# Patient Record
Sex: Male | Born: 1950 | Race: White | Hispanic: No | Smoking: Former smoker
Health system: Southern US, Community
[De-identification: ages and names within clinical notes are randomized; demographics above are authoritative.]

## PROBLEM LIST (undated history)

## (undated) DIAGNOSIS — C4490 Unspecified malignant neoplasm of skin, unspecified: Secondary | ICD-10-CM

## (undated) DIAGNOSIS — E079 Disorder of thyroid, unspecified: Secondary | ICD-10-CM

## (undated) DIAGNOSIS — B1920 Unspecified viral hepatitis C without hepatic coma: Secondary | ICD-10-CM

## (undated) HISTORY — DX: Unspecified viral hepatitis C without hepatic coma: B19.20

## (undated) HISTORY — DX: Disorder of thyroid, unspecified: E07.9

## (undated) HISTORY — PX: TONSILLECTOMY AND ADENOIDECTOMY: SHX28

## (undated) HISTORY — DX: Unspecified malignant neoplasm of skin, unspecified: C44.90

## (undated) HISTORY — PX: BACK SURGERY: SHX140

---

## 2007-12-08 ENCOUNTER — Ambulatory Visit (HOSPITAL_COMMUNITY): Admission: RE | Admit: 2007-12-08 | Discharge: 2007-12-08 | Payer: Self-pay | Admitting: Internal Medicine

## 2007-12-19 ENCOUNTER — Encounter (HOSPITAL_COMMUNITY): Admission: RE | Admit: 2007-12-19 | Discharge: 2008-01-18 | Payer: Self-pay | Admitting: Internal Medicine

## 2008-07-31 ENCOUNTER — Ambulatory Visit (HOSPITAL_COMMUNITY): Admission: RE | Admit: 2008-07-31 | Discharge: 2008-07-31 | Payer: Self-pay | Admitting: Endocrinology

## 2008-12-05 ENCOUNTER — Ambulatory Visit (HOSPITAL_COMMUNITY): Admission: RE | Admit: 2008-12-05 | Discharge: 2008-12-05 | Payer: Self-pay | Admitting: Endocrinology

## 2009-12-11 ENCOUNTER — Ambulatory Visit (HOSPITAL_COMMUNITY): Admission: RE | Admit: 2009-12-11 | Discharge: 2009-12-11 | Payer: Self-pay | Admitting: Endocrinology

## 2010-08-24 ENCOUNTER — Encounter: Payer: Self-pay | Admitting: Endocrinology

## 2012-04-20 ENCOUNTER — Other Ambulatory Visit (HOSPITAL_COMMUNITY): Payer: Self-pay | Admitting: Endocrinology

## 2012-04-20 DIAGNOSIS — E049 Nontoxic goiter, unspecified: Secondary | ICD-10-CM

## 2012-04-25 ENCOUNTER — Ambulatory Visit (HOSPITAL_COMMUNITY)
Admission: RE | Admit: 2012-04-25 | Discharge: 2012-04-25 | Disposition: A | Discharge status: Home / Self Care | Payer: No Typology Code available for payment source | Source: Ambulatory Visit | Attending: Endocrinology | Admitting: Endocrinology

## 2012-04-25 DIAGNOSIS — E049 Nontoxic goiter, unspecified: Secondary | ICD-10-CM

## 2012-08-28 ENCOUNTER — Other Ambulatory Visit (HOSPITAL_COMMUNITY): Payer: Self-pay | Admitting: Endocrinology

## 2012-08-28 DIAGNOSIS — E049 Nontoxic goiter, unspecified: Secondary | ICD-10-CM

## 2012-08-29 ENCOUNTER — Ambulatory Visit (HOSPITAL_COMMUNITY)
Admission: RE | Admit: 2012-08-29 | Discharge: 2012-08-29 | Disposition: A | Discharge status: Home / Self Care | Payer: PRIVATE HEALTH INSURANCE | Source: Ambulatory Visit | Attending: Endocrinology | Admitting: Endocrinology

## 2012-08-29 DIAGNOSIS — E049 Nontoxic goiter, unspecified: Secondary | ICD-10-CM

## 2012-08-29 DIAGNOSIS — E041 Nontoxic single thyroid nodule: Secondary | ICD-10-CM | POA: Insufficient documentation

## 2013-04-05 ENCOUNTER — Other Ambulatory Visit (HOSPITAL_COMMUNITY): Payer: Self-pay | Admitting: Family Medicine

## 2013-04-05 ENCOUNTER — Ambulatory Visit (HOSPITAL_COMMUNITY)
Admission: RE | Admit: 2013-04-05 | Discharge: 2013-04-05 | Disposition: A | Discharge status: Home / Self Care | Payer: Disability Insurance | Source: Ambulatory Visit | Attending: Family Medicine | Admitting: Family Medicine

## 2013-04-05 DIAGNOSIS — M542 Cervicalgia: Secondary | ICD-10-CM | POA: Insufficient documentation

## 2013-04-05 DIAGNOSIS — M5380 Other specified dorsopathies, site unspecified: Secondary | ICD-10-CM | POA: Insufficient documentation

## 2013-04-17 ENCOUNTER — Other Ambulatory Visit (HOSPITAL_COMMUNITY): Payer: Self-pay | Admitting: Endocrinology

## 2013-04-17 DIAGNOSIS — E049 Nontoxic goiter, unspecified: Secondary | ICD-10-CM

## 2013-06-19 ENCOUNTER — Other Ambulatory Visit (HOSPITAL_COMMUNITY): Payer: Self-pay | Admitting: Internal Medicine

## 2013-06-19 DIAGNOSIS — M542 Cervicalgia: Secondary | ICD-10-CM

## 2013-06-21 ENCOUNTER — Other Ambulatory Visit (HOSPITAL_COMMUNITY): Payer: BC Managed Care – PPO

## 2013-07-16 ENCOUNTER — Other Ambulatory Visit (HOSPITAL_COMMUNITY): Payer: BC Managed Care – PPO

## 2013-07-30 ENCOUNTER — Other Ambulatory Visit (HOSPITAL_COMMUNITY): Payer: Self-pay | Admitting: Family Medicine

## 2013-07-30 DIAGNOSIS — G8929 Other chronic pain: Secondary | ICD-10-CM

## 2013-07-30 DIAGNOSIS — M542 Cervicalgia: Secondary | ICD-10-CM

## 2013-08-03 ENCOUNTER — Other Ambulatory Visit (HOSPITAL_COMMUNITY): Payer: BC Managed Care – PPO

## 2013-08-03 ENCOUNTER — Ambulatory Visit (HOSPITAL_COMMUNITY)
Admission: RE | Admit: 2013-08-03 | Discharge: 2013-08-03 | Disposition: A | Discharge status: Home / Self Care | Payer: BC Managed Care – PPO | Source: Ambulatory Visit | Attending: Family Medicine | Admitting: Family Medicine

## 2013-08-03 DIAGNOSIS — M539 Dorsopathy, unspecified: Secondary | ICD-10-CM | POA: Insufficient documentation

## 2013-08-03 DIAGNOSIS — M4802 Spinal stenosis, cervical region: Secondary | ICD-10-CM | POA: Insufficient documentation

## 2013-08-03 DIAGNOSIS — G8929 Other chronic pain: Secondary | ICD-10-CM

## 2013-08-03 DIAGNOSIS — M542 Cervicalgia: Secondary | ICD-10-CM | POA: Insufficient documentation

## 2013-08-07 ENCOUNTER — Ambulatory Visit (HOSPITAL_COMMUNITY)
Admission: RE | Admit: 2013-08-07 | Discharge: 2013-08-07 | Disposition: A | Discharge status: Home / Self Care | Payer: BC Managed Care – PPO | Source: Ambulatory Visit | Attending: Endocrinology | Admitting: Endocrinology

## 2013-08-07 DIAGNOSIS — E041 Nontoxic single thyroid nodule: Secondary | ICD-10-CM | POA: Insufficient documentation

## 2013-08-07 DIAGNOSIS — E049 Nontoxic goiter, unspecified: Secondary | ICD-10-CM

## 2014-07-02 ENCOUNTER — Other Ambulatory Visit (HOSPITAL_COMMUNITY): Payer: Self-pay | Admitting: Endocrinology

## 2014-07-02 DIAGNOSIS — E049 Nontoxic goiter, unspecified: Secondary | ICD-10-CM

## 2014-07-08 ENCOUNTER — Ambulatory Visit (HOSPITAL_COMMUNITY)
Admission: RE | Admit: 2014-07-08 | Discharge: 2014-07-08 | Disposition: A | Discharge status: Home / Self Care | Payer: BC Managed Care – PPO | Source: Ambulatory Visit | Attending: Endocrinology | Admitting: Endocrinology

## 2014-07-08 DIAGNOSIS — E041 Nontoxic single thyroid nodule: Secondary | ICD-10-CM | POA: Insufficient documentation

## 2014-07-08 DIAGNOSIS — E049 Nontoxic goiter, unspecified: Secondary | ICD-10-CM

## 2015-07-22 ENCOUNTER — Other Ambulatory Visit (HOSPITAL_COMMUNITY): Payer: Self-pay | Admitting: Endocrinology

## 2015-07-22 DIAGNOSIS — E049 Nontoxic goiter, unspecified: Secondary | ICD-10-CM

## 2015-08-07 ENCOUNTER — Ambulatory Visit (HOSPITAL_COMMUNITY)
Admission: RE | Admit: 2015-08-07 | Discharge: 2015-08-07 | Disposition: A | Discharge status: Home / Self Care | Payer: BLUE CROSS/BLUE SHIELD | Source: Ambulatory Visit | Attending: Endocrinology | Admitting: Endocrinology

## 2015-08-07 DIAGNOSIS — E041 Nontoxic single thyroid nodule: Secondary | ICD-10-CM | POA: Diagnosis present

## 2015-08-07 DIAGNOSIS — E049 Nontoxic goiter, unspecified: Secondary | ICD-10-CM

## 2016-03-15 DIAGNOSIS — M47812 Spondylosis without myelopathy or radiculopathy, cervical region: Secondary | ICD-10-CM | POA: Diagnosis not present

## 2016-03-15 DIAGNOSIS — Z6827 Body mass index (BMI) 27.0-27.9, adult: Secondary | ICD-10-CM | POA: Diagnosis not present

## 2016-03-15 DIAGNOSIS — G894 Chronic pain syndrome: Secondary | ICD-10-CM | POA: Diagnosis not present

## 2016-04-28 DIAGNOSIS — Z23 Encounter for immunization: Secondary | ICD-10-CM | POA: Diagnosis not present

## 2016-05-27 DIAGNOSIS — Z0000 Encounter for general adult medical examination without abnormal findings: Secondary | ICD-10-CM | POA: Diagnosis not present

## 2016-05-27 DIAGNOSIS — L739 Follicular disorder, unspecified: Secondary | ICD-10-CM | POA: Diagnosis not present

## 2016-05-27 DIAGNOSIS — M47812 Spondylosis without myelopathy or radiculopathy, cervical region: Secondary | ICD-10-CM | POA: Diagnosis not present

## 2016-05-27 DIAGNOSIS — Z6827 Body mass index (BMI) 27.0-27.9, adult: Secondary | ICD-10-CM | POA: Diagnosis not present

## 2016-05-27 DIAGNOSIS — G894 Chronic pain syndrome: Secondary | ICD-10-CM | POA: Diagnosis not present

## 2016-08-30 DIAGNOSIS — D2272 Melanocytic nevi of left lower limb, including hip: Secondary | ICD-10-CM | POA: Diagnosis not present

## 2016-08-30 DIAGNOSIS — D2261 Melanocytic nevi of right upper limb, including shoulder: Secondary | ICD-10-CM | POA: Diagnosis not present

## 2016-08-30 DIAGNOSIS — D225 Melanocytic nevi of trunk: Secondary | ICD-10-CM | POA: Diagnosis not present

## 2016-08-30 DIAGNOSIS — L821 Other seborrheic keratosis: Secondary | ICD-10-CM | POA: Diagnosis not present

## 2016-08-30 DIAGNOSIS — L820 Inflamed seborrheic keratosis: Secondary | ICD-10-CM | POA: Diagnosis not present

## 2016-08-30 DIAGNOSIS — L570 Actinic keratosis: Secondary | ICD-10-CM | POA: Diagnosis not present

## 2016-08-31 DIAGNOSIS — E663 Overweight: Secondary | ICD-10-CM | POA: Diagnosis not present

## 2016-08-31 DIAGNOSIS — Z6828 Body mass index (BMI) 28.0-28.9, adult: Secondary | ICD-10-CM | POA: Diagnosis not present

## 2016-08-31 DIAGNOSIS — G894 Chronic pain syndrome: Secondary | ICD-10-CM | POA: Diagnosis not present

## 2016-08-31 DIAGNOSIS — M47812 Spondylosis without myelopathy or radiculopathy, cervical region: Secondary | ICD-10-CM | POA: Diagnosis not present

## 2016-08-31 DIAGNOSIS — Z1389 Encounter for screening for other disorder: Secondary | ICD-10-CM | POA: Diagnosis not present

## 2016-09-03 DIAGNOSIS — G894 Chronic pain syndrome: Secondary | ICD-10-CM | POA: Diagnosis not present

## 2016-09-03 DIAGNOSIS — Z6828 Body mass index (BMI) 28.0-28.9, adult: Secondary | ICD-10-CM | POA: Diagnosis not present

## 2016-11-11 DIAGNOSIS — R69 Illness, unspecified: Secondary | ICD-10-CM | POA: Diagnosis not present

## 2016-11-24 DIAGNOSIS — G894 Chronic pain syndrome: Secondary | ICD-10-CM | POA: Diagnosis not present

## 2016-11-24 DIAGNOSIS — E663 Overweight: Secondary | ICD-10-CM | POA: Diagnosis not present

## 2016-11-24 DIAGNOSIS — Z6827 Body mass index (BMI) 27.0-27.9, adult: Secondary | ICD-10-CM | POA: Diagnosis not present

## 2016-11-30 DIAGNOSIS — R69 Illness, unspecified: Secondary | ICD-10-CM | POA: Diagnosis not present

## 2016-12-01 DIAGNOSIS — E119 Type 2 diabetes mellitus without complications: Secondary | ICD-10-CM | POA: Diagnosis not present

## 2016-12-01 DIAGNOSIS — H524 Presbyopia: Secondary | ICD-10-CM | POA: Diagnosis not present

## 2017-01-17 DIAGNOSIS — R69 Illness, unspecified: Secondary | ICD-10-CM | POA: Diagnosis not present

## 2017-01-17 DIAGNOSIS — Z6826 Body mass index (BMI) 26.0-26.9, adult: Secondary | ICD-10-CM | POA: Diagnosis not present

## 2017-01-17 DIAGNOSIS — G894 Chronic pain syndrome: Secondary | ICD-10-CM | POA: Diagnosis not present

## 2017-03-10 DIAGNOSIS — T07XXXA Unspecified multiple injuries, initial encounter: Secondary | ICD-10-CM | POA: Diagnosis not present

## 2017-03-10 DIAGNOSIS — Z6827 Body mass index (BMI) 27.0-27.9, adult: Secondary | ICD-10-CM | POA: Diagnosis not present

## 2017-03-10 DIAGNOSIS — Z1389 Encounter for screening for other disorder: Secondary | ICD-10-CM | POA: Diagnosis not present

## 2017-03-10 DIAGNOSIS — E663 Overweight: Secondary | ICD-10-CM | POA: Diagnosis not present

## 2017-04-11 DIAGNOSIS — Z6827 Body mass index (BMI) 27.0-27.9, adult: Secondary | ICD-10-CM | POA: Diagnosis not present

## 2017-04-11 DIAGNOSIS — H61892 Other specified disorders of left external ear: Secondary | ICD-10-CM | POA: Diagnosis not present

## 2017-04-11 DIAGNOSIS — E663 Overweight: Secondary | ICD-10-CM | POA: Diagnosis not present

## 2017-04-11 DIAGNOSIS — G894 Chronic pain syndrome: Secondary | ICD-10-CM | POA: Diagnosis not present

## 2017-04-26 DIAGNOSIS — Z23 Encounter for immunization: Secondary | ICD-10-CM | POA: Diagnosis not present

## 2017-05-16 ENCOUNTER — Ambulatory Visit (INDEPENDENT_AMBULATORY_CARE_PROVIDER_SITE_OTHER): Payer: Medicare HMO | Admitting: Otolaryngology

## 2017-05-16 DIAGNOSIS — H903 Sensorineural hearing loss, bilateral: Secondary | ICD-10-CM

## 2017-06-01 DIAGNOSIS — R69 Illness, unspecified: Secondary | ICD-10-CM | POA: Diagnosis not present

## 2017-06-17 DIAGNOSIS — Z1389 Encounter for screening for other disorder: Secondary | ICD-10-CM | POA: Diagnosis not present

## 2017-06-17 DIAGNOSIS — Z6827 Body mass index (BMI) 27.0-27.9, adult: Secondary | ICD-10-CM | POA: Diagnosis not present

## 2017-06-17 DIAGNOSIS — R69 Illness, unspecified: Secondary | ICD-10-CM | POA: Diagnosis not present

## 2017-06-17 DIAGNOSIS — M5412 Radiculopathy, cervical region: Secondary | ICD-10-CM | POA: Diagnosis not present

## 2017-06-17 DIAGNOSIS — Z0001 Encounter for general adult medical examination with abnormal findings: Secondary | ICD-10-CM | POA: Diagnosis not present

## 2017-06-17 DIAGNOSIS — R946 Abnormal results of thyroid function studies: Secondary | ICD-10-CM | POA: Diagnosis not present

## 2017-06-17 DIAGNOSIS — Z125 Encounter for screening for malignant neoplasm of prostate: Secondary | ICD-10-CM | POA: Diagnosis not present

## 2017-06-17 DIAGNOSIS — E663 Overweight: Secondary | ICD-10-CM | POA: Diagnosis not present

## 2017-06-20 DIAGNOSIS — E748 Other specified disorders of carbohydrate metabolism: Secondary | ICD-10-CM | POA: Diagnosis not present

## 2017-06-27 DIAGNOSIS — E663 Overweight: Secondary | ICD-10-CM | POA: Diagnosis not present

## 2017-06-27 DIAGNOSIS — Z6828 Body mass index (BMI) 28.0-28.9, adult: Secondary | ICD-10-CM | POA: Diagnosis not present

## 2017-06-27 DIAGNOSIS — R69 Illness, unspecified: Secondary | ICD-10-CM | POA: Diagnosis not present

## 2017-06-27 DIAGNOSIS — E063 Autoimmune thyroiditis: Secondary | ICD-10-CM | POA: Diagnosis not present

## 2017-06-27 DIAGNOSIS — G894 Chronic pain syndrome: Secondary | ICD-10-CM | POA: Diagnosis not present

## 2017-08-17 ENCOUNTER — Other Ambulatory Visit (HOSPITAL_COMMUNITY): Payer: Self-pay | Admitting: Endocrinology

## 2017-08-17 DIAGNOSIS — E039 Hypothyroidism, unspecified: Secondary | ICD-10-CM | POA: Diagnosis not present

## 2017-08-17 DIAGNOSIS — E049 Nontoxic goiter, unspecified: Secondary | ICD-10-CM

## 2017-08-24 ENCOUNTER — Ambulatory Visit (HOSPITAL_COMMUNITY)
Admission: RE | Admit: 2017-08-24 | Discharge: 2017-08-24 | Disposition: A | Discharge status: Home / Self Care | Payer: Medicare HMO | Source: Ambulatory Visit | Attending: Endocrinology | Admitting: Endocrinology

## 2017-08-24 DIAGNOSIS — E041 Nontoxic single thyroid nodule: Secondary | ICD-10-CM | POA: Insufficient documentation

## 2017-08-24 DIAGNOSIS — E049 Nontoxic goiter, unspecified: Secondary | ICD-10-CM

## 2017-09-09 DIAGNOSIS — R69 Illness, unspecified: Secondary | ICD-10-CM | POA: Diagnosis not present

## 2017-09-09 DIAGNOSIS — Z6827 Body mass index (BMI) 27.0-27.9, adult: Secondary | ICD-10-CM | POA: Diagnosis not present

## 2017-09-09 DIAGNOSIS — E663 Overweight: Secondary | ICD-10-CM | POA: Diagnosis not present

## 2017-09-09 DIAGNOSIS — G894 Chronic pain syndrome: Secondary | ICD-10-CM | POA: Diagnosis not present

## 2017-09-09 DIAGNOSIS — E063 Autoimmune thyroiditis: Secondary | ICD-10-CM | POA: Diagnosis not present

## 2017-10-24 DIAGNOSIS — D225 Melanocytic nevi of trunk: Secondary | ICD-10-CM | POA: Diagnosis not present

## 2017-10-24 DIAGNOSIS — D045 Carcinoma in situ of skin of trunk: Secondary | ICD-10-CM | POA: Diagnosis not present

## 2017-10-24 DIAGNOSIS — D2272 Melanocytic nevi of left lower limb, including hip: Secondary | ICD-10-CM | POA: Diagnosis not present

## 2017-10-24 DIAGNOSIS — L720 Epidermal cyst: Secondary | ICD-10-CM | POA: Diagnosis not present

## 2017-10-24 DIAGNOSIS — L821 Other seborrheic keratosis: Secondary | ICD-10-CM | POA: Diagnosis not present

## 2017-11-07 DIAGNOSIS — L821 Other seborrheic keratosis: Secondary | ICD-10-CM | POA: Diagnosis not present

## 2017-11-07 DIAGNOSIS — D045 Carcinoma in situ of skin of trunk: Secondary | ICD-10-CM | POA: Diagnosis not present

## 2017-11-18 DIAGNOSIS — Z1389 Encounter for screening for other disorder: Secondary | ICD-10-CM | POA: Diagnosis not present

## 2017-11-18 DIAGNOSIS — Z6827 Body mass index (BMI) 27.0-27.9, adult: Secondary | ICD-10-CM | POA: Diagnosis not present

## 2017-11-18 DIAGNOSIS — G894 Chronic pain syndrome: Secondary | ICD-10-CM | POA: Diagnosis not present

## 2017-11-18 DIAGNOSIS — E663 Overweight: Secondary | ICD-10-CM | POA: Diagnosis not present

## 2017-12-08 DIAGNOSIS — Z6827 Body mass index (BMI) 27.0-27.9, adult: Secondary | ICD-10-CM | POA: Diagnosis not present

## 2017-12-08 DIAGNOSIS — G894 Chronic pain syndrome: Secondary | ICD-10-CM | POA: Diagnosis not present

## 2017-12-08 DIAGNOSIS — E663 Overweight: Secondary | ICD-10-CM | POA: Diagnosis not present

## 2018-01-03 DIAGNOSIS — R69 Illness, unspecified: Secondary | ICD-10-CM | POA: Diagnosis not present

## 2018-01-17 DIAGNOSIS — R69 Illness, unspecified: Secondary | ICD-10-CM | POA: Diagnosis not present

## 2018-02-07 DIAGNOSIS — G894 Chronic pain syndrome: Secondary | ICD-10-CM | POA: Diagnosis not present

## 2018-02-07 DIAGNOSIS — Z6827 Body mass index (BMI) 27.0-27.9, adult: Secondary | ICD-10-CM | POA: Diagnosis not present

## 2018-02-07 DIAGNOSIS — R69 Illness, unspecified: Secondary | ICD-10-CM | POA: Diagnosis not present

## 2018-02-07 DIAGNOSIS — E063 Autoimmune thyroiditis: Secondary | ICD-10-CM | POA: Diagnosis not present

## 2018-04-22 DIAGNOSIS — R69 Illness, unspecified: Secondary | ICD-10-CM | POA: Diagnosis not present

## 2018-04-25 DIAGNOSIS — E663 Overweight: Secondary | ICD-10-CM | POA: Diagnosis not present

## 2018-04-25 DIAGNOSIS — Z1389 Encounter for screening for other disorder: Secondary | ICD-10-CM | POA: Diagnosis not present

## 2018-04-25 DIAGNOSIS — E063 Autoimmune thyroiditis: Secondary | ICD-10-CM | POA: Diagnosis not present

## 2018-04-25 DIAGNOSIS — G894 Chronic pain syndrome: Secondary | ICD-10-CM | POA: Diagnosis not present

## 2018-04-25 DIAGNOSIS — R69 Illness, unspecified: Secondary | ICD-10-CM | POA: Diagnosis not present

## 2018-04-25 DIAGNOSIS — Z6827 Body mass index (BMI) 27.0-27.9, adult: Secondary | ICD-10-CM | POA: Diagnosis not present

## 2018-05-24 DIAGNOSIS — Z6827 Body mass index (BMI) 27.0-27.9, adult: Secondary | ICD-10-CM | POA: Diagnosis not present

## 2018-05-24 DIAGNOSIS — G894 Chronic pain syndrome: Secondary | ICD-10-CM | POA: Diagnosis not present

## 2018-05-24 DIAGNOSIS — E063 Autoimmune thyroiditis: Secondary | ICD-10-CM | POA: Diagnosis not present

## 2018-05-24 DIAGNOSIS — N400 Enlarged prostate without lower urinary tract symptoms: Secondary | ICD-10-CM | POA: Diagnosis not present

## 2018-05-24 DIAGNOSIS — Z125 Encounter for screening for malignant neoplasm of prostate: Secondary | ICD-10-CM | POA: Diagnosis not present

## 2018-06-07 DIAGNOSIS — Z6827 Body mass index (BMI) 27.0-27.9, adult: Secondary | ICD-10-CM | POA: Diagnosis not present

## 2018-06-07 DIAGNOSIS — E663 Overweight: Secondary | ICD-10-CM | POA: Diagnosis not present

## 2018-06-07 DIAGNOSIS — R3129 Other microscopic hematuria: Secondary | ICD-10-CM | POA: Diagnosis not present

## 2018-06-08 ENCOUNTER — Other Ambulatory Visit (HOSPITAL_COMMUNITY): Payer: Self-pay | Admitting: Internal Medicine

## 2018-06-08 DIAGNOSIS — R319 Hematuria, unspecified: Secondary | ICD-10-CM

## 2018-06-30 ENCOUNTER — Ambulatory Visit (HOSPITAL_COMMUNITY): Payer: Medicare HMO

## 2018-07-07 DIAGNOSIS — R69 Illness, unspecified: Secondary | ICD-10-CM | POA: Diagnosis not present

## 2018-07-07 DIAGNOSIS — E063 Autoimmune thyroiditis: Secondary | ICD-10-CM | POA: Diagnosis not present

## 2018-07-07 DIAGNOSIS — G894 Chronic pain syndrome: Secondary | ICD-10-CM | POA: Diagnosis not present

## 2018-07-07 DIAGNOSIS — Z6827 Body mass index (BMI) 27.0-27.9, adult: Secondary | ICD-10-CM | POA: Diagnosis not present

## 2018-07-07 DIAGNOSIS — Z0001 Encounter for general adult medical examination with abnormal findings: Secondary | ICD-10-CM | POA: Diagnosis not present

## 2018-07-07 DIAGNOSIS — Z1389 Encounter for screening for other disorder: Secondary | ICD-10-CM | POA: Diagnosis not present

## 2018-07-07 DIAGNOSIS — E663 Overweight: Secondary | ICD-10-CM | POA: Diagnosis not present

## 2018-07-07 DIAGNOSIS — M5030 Other cervical disc degeneration, unspecified cervical region: Secondary | ICD-10-CM | POA: Diagnosis not present

## 2018-07-12 ENCOUNTER — Other Ambulatory Visit (HOSPITAL_COMMUNITY): Payer: Self-pay | Admitting: Internal Medicine

## 2018-07-12 ENCOUNTER — Ambulatory Visit (HOSPITAL_COMMUNITY)
Admission: RE | Admit: 2018-07-12 | Discharge: 2018-07-12 | Disposition: A | Discharge status: Home / Self Care | Payer: Medicare HMO | Source: Ambulatory Visit | Attending: Internal Medicine | Admitting: Internal Medicine

## 2018-07-12 DIAGNOSIS — N132 Hydronephrosis with renal and ureteral calculous obstruction: Secondary | ICD-10-CM | POA: Diagnosis not present

## 2018-07-12 DIAGNOSIS — R319 Hematuria, unspecified: Secondary | ICD-10-CM | POA: Diagnosis not present

## 2018-07-12 LAB — POCT I-STAT CREATININE: CREATININE: 0.7 mg/dL (ref 0.61–1.24)

## 2018-07-12 MED ORDER — IOPAMIDOL (ISOVUE-300) INJECTION 61%
150.0000 mL | Freq: Once | INTRAVENOUS | Status: AC | PRN
  Administered 2018-07-12: 125 mL via INTRAVENOUS

## 2018-07-24 ENCOUNTER — Encounter: Payer: Self-pay | Admitting: Gastroenterology

## 2018-07-25 IMAGING — US US THYROID
1 series · 13 of 25 positions shown · non-contrast
Comparison: 08/07/2015

CLINICAL DATA: Goiter.  Follow-up nontoxic goiter.

EXAM:
THYROID ULTRASOUND
TECHNIQUE: Ultrasound examination of the thyroid gland and adjacent soft
tissues was performed.

[Series 1: us thyroid · 0.06mm/px · 13 of 56 slices shown]
[im 1/56]
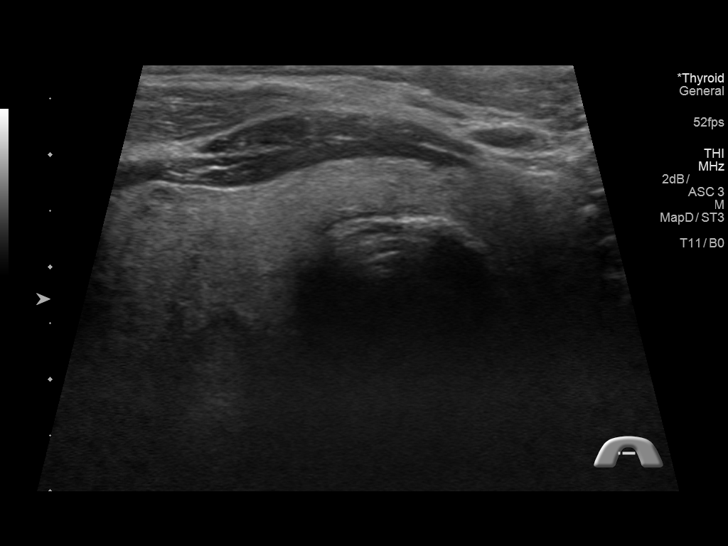
[im 5/56]
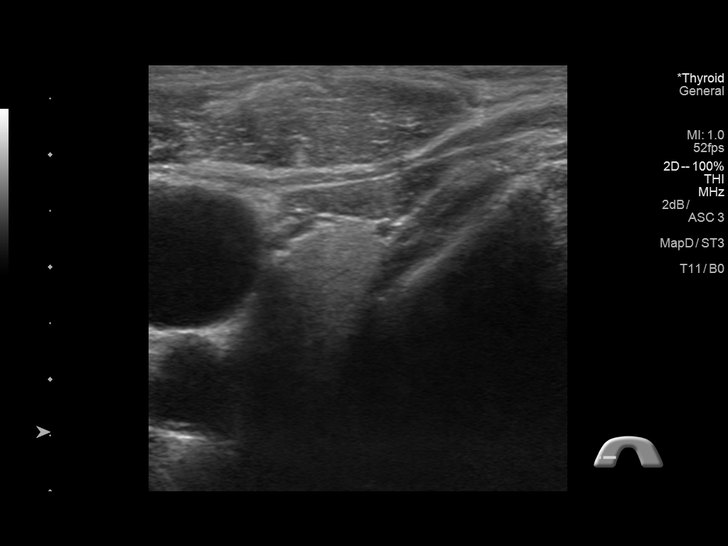
[im 10/56]
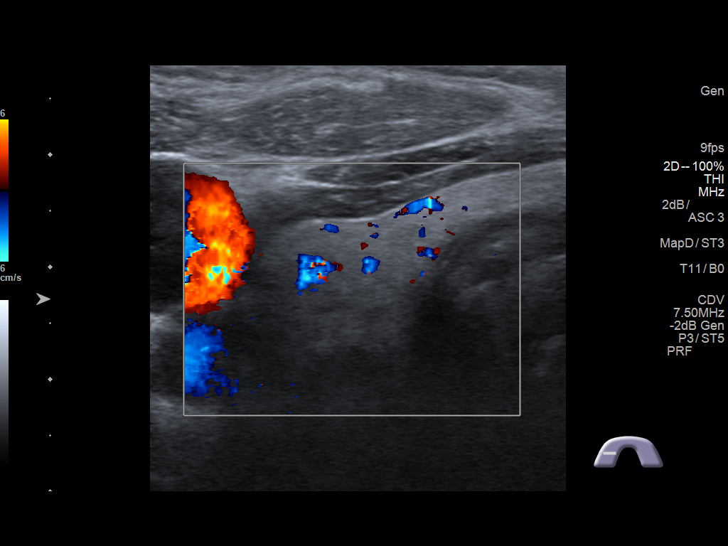
[im 14/56]
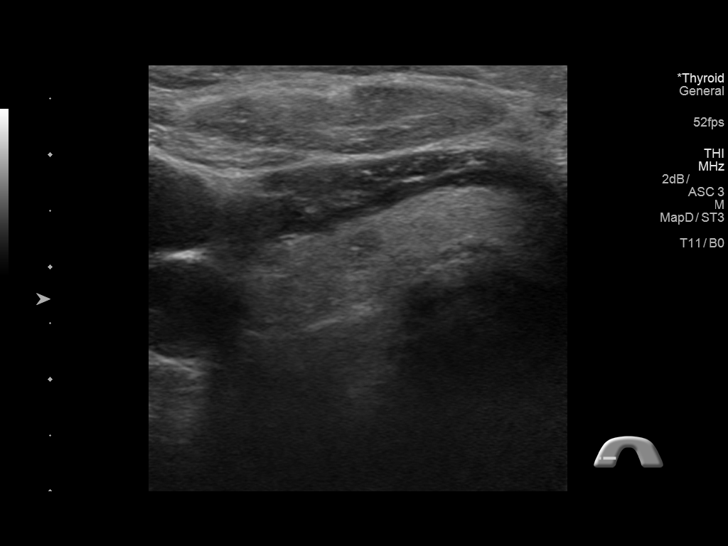
[im 19/56]
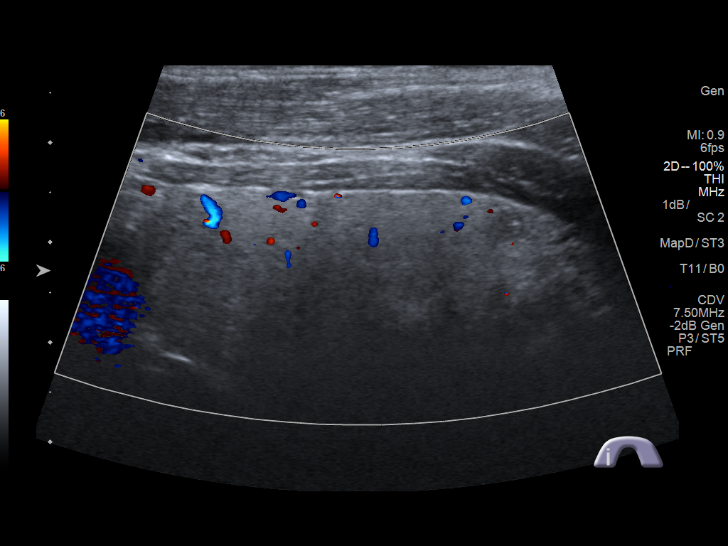
[im 23/56]
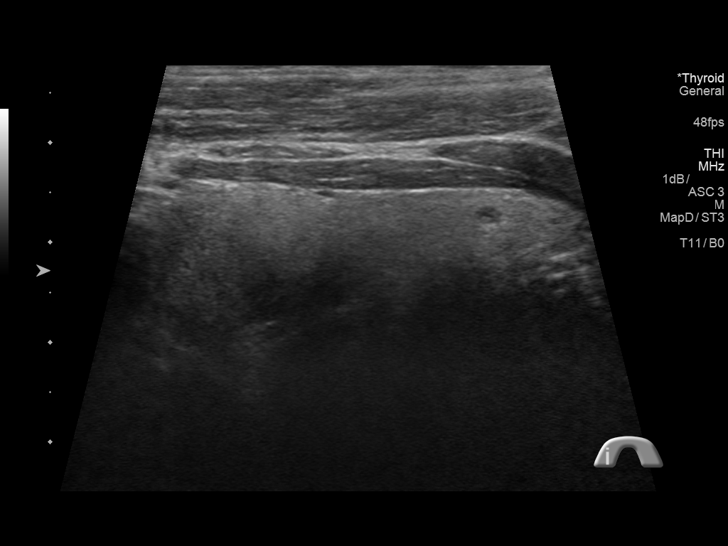
[im 28/56]
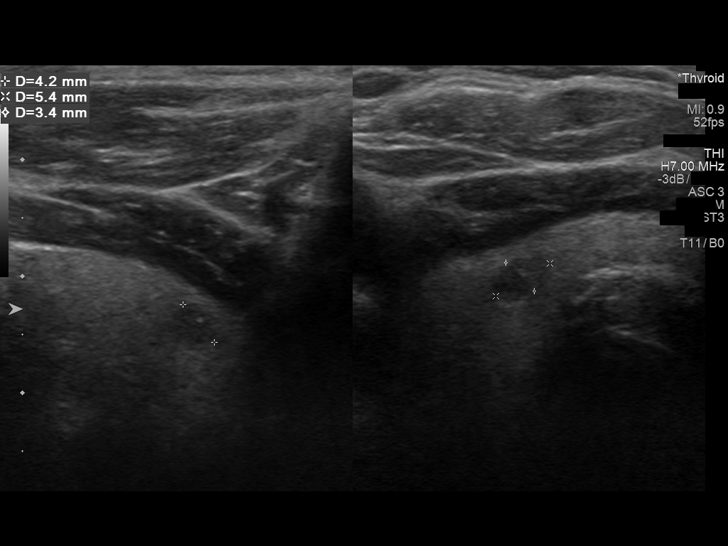
[im 33/56]
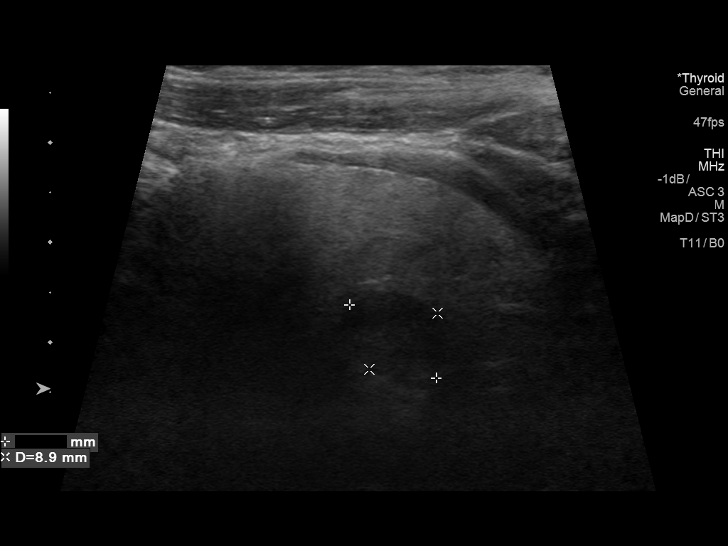
[im 37/56]
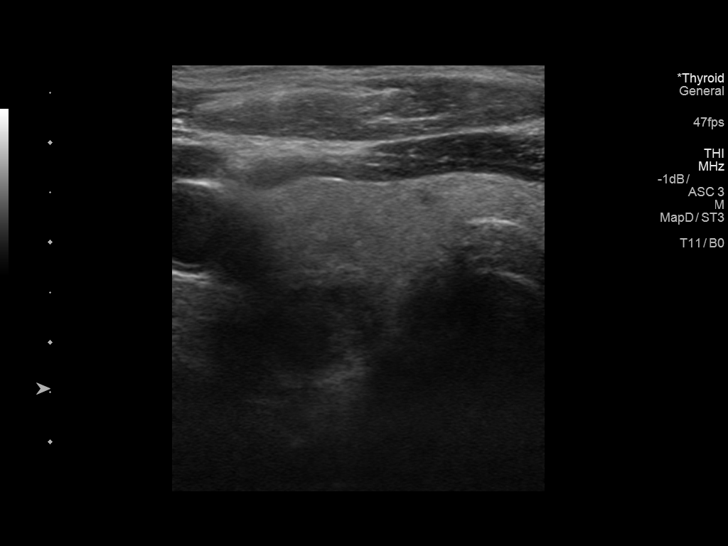
[im 42/56]
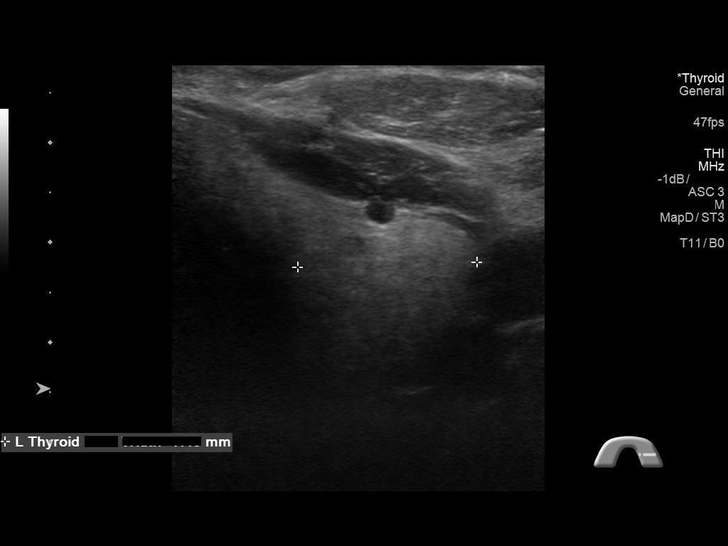
[im 46/56]
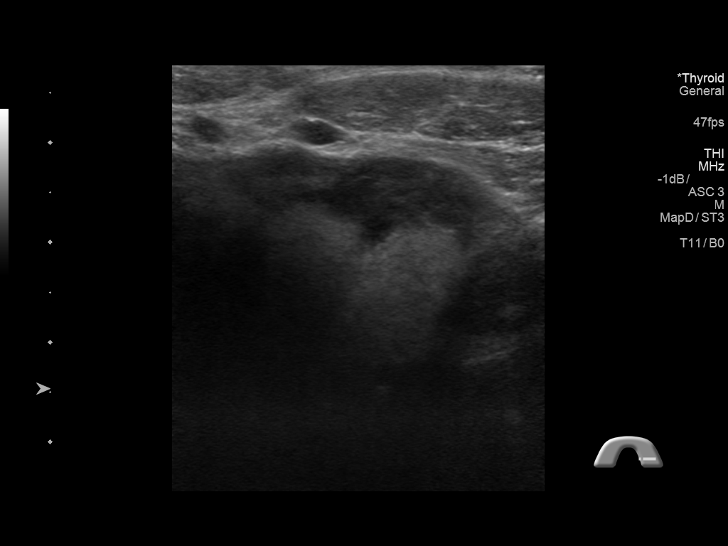
[im 51/56]
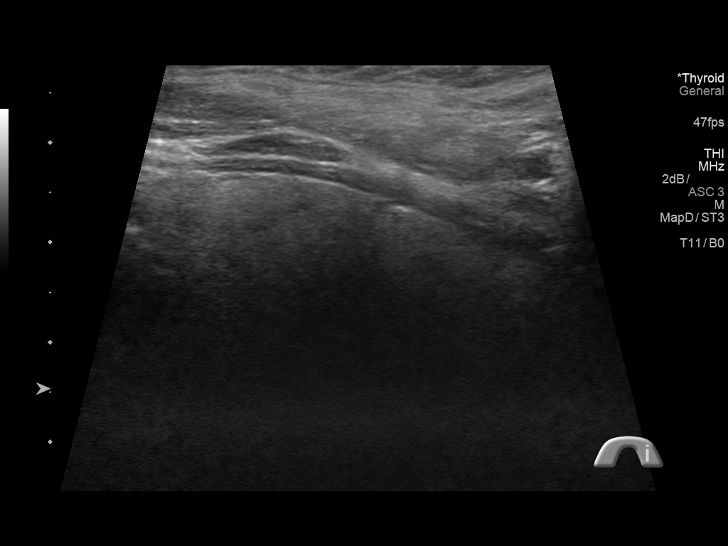
[im 56/56]
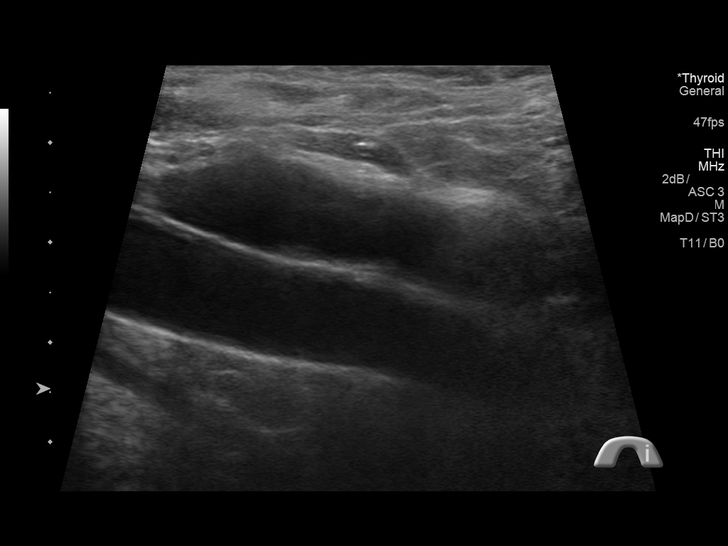

[13 of 25 positions shown; findings below may reference images not displayed]

FINDINGS: Parenchymal Echotexture: Mildly heterogenous

Isthmus: 0.5 cm, previously 0.3 cm

Right lobe: 5.3 x 1.6 x 1.5 cm, previously 5.0 x 2.0 x 2.0 cm

Left lobe: 5.0 x 2.1 x 1.8 cm, previously 5.2 x 2.8 x 2.4 cm

_________________________________________________________

Estimated total number of nodules >/= 1 cm: 1

Number of spongiform nodules >/=  2 cm not described below (TR1): 0

Number of mixed cystic and solid nodules >/= 1.5 cm not described
below (TR2): 0

_________________________________________________________

Nodule # 1:

Prior biopsy: No

Location: Right; Mid

Maximum size: 1.1 cm; Other 2 dimensions: 1.1 x 0.9 cm, previously,
1.2 x 1.1 x 1.1 cm

Composition: solid/almost completely solid (2)

Echogenicity: very hypoechoic (3)

Shape: not taller-than-wide (0)

Margins: smooth (0)

Echogenic foci: none (0)

ACR TI-RADS total points: 5.

ACR TI-RADS risk category:  TR4 (4-6 points).

Significant change in size (>/= 20% in two dimensions and minimal
increase of 2 mm): No

Change in features: No

Change in ACR TI-RADS risk category: No

ACR TI-RADS recommendations:

*Given size (>/= 1 - 1.4 cm) and appearance, a follow-up ultrasound
in 1 year should be considered based on TI-RADS criteria.

_________________________________________________________

0.5 cm right lower pole nodule does not meet criteria for follow-up
nor biopsy.
IMPRESSION: Stable right mid lobe nodule 1 dating back 5 years. This supports
benign etiology.

The above is in keeping with the ACR TI-RADS recommendations - [HOSPITAL] 4513;[DATE].

## 2018-08-04 DIAGNOSIS — R3129 Other microscopic hematuria: Secondary | ICD-10-CM | POA: Diagnosis not present

## 2018-08-04 DIAGNOSIS — Z6827 Body mass index (BMI) 27.0-27.9, adult: Secondary | ICD-10-CM | POA: Diagnosis not present

## 2018-08-04 DIAGNOSIS — G894 Chronic pain syndrome: Secondary | ICD-10-CM | POA: Diagnosis not present

## 2018-08-04 DIAGNOSIS — N200 Calculus of kidney: Secondary | ICD-10-CM | POA: Diagnosis not present

## 2018-08-07 DIAGNOSIS — L720 Epidermal cyst: Secondary | ICD-10-CM | POA: Diagnosis not present

## 2018-08-23 ENCOUNTER — Ambulatory Visit: Payer: Medicare HMO | Admitting: Urology

## 2018-08-23 DIAGNOSIS — R311 Benign essential microscopic hematuria: Secondary | ICD-10-CM | POA: Diagnosis not present

## 2018-08-29 DIAGNOSIS — E063 Autoimmune thyroiditis: Secondary | ICD-10-CM | POA: Diagnosis not present

## 2018-08-29 DIAGNOSIS — R69 Illness, unspecified: Secondary | ICD-10-CM | POA: Diagnosis not present

## 2018-08-29 DIAGNOSIS — E663 Overweight: Secondary | ICD-10-CM | POA: Diagnosis not present

## 2018-08-29 DIAGNOSIS — Z6827 Body mass index (BMI) 27.0-27.9, adult: Secondary | ICD-10-CM | POA: Diagnosis not present

## 2018-08-29 DIAGNOSIS — G894 Chronic pain syndrome: Secondary | ICD-10-CM | POA: Diagnosis not present

## 2018-08-30 ENCOUNTER — Ambulatory Visit: Payer: Medicare HMO | Admitting: Urology

## 2018-09-25 DIAGNOSIS — R69 Illness, unspecified: Secondary | ICD-10-CM | POA: Diagnosis not present

## 2018-09-25 DIAGNOSIS — E063 Autoimmune thyroiditis: Secondary | ICD-10-CM | POA: Diagnosis not present

## 2018-09-25 DIAGNOSIS — Z6827 Body mass index (BMI) 27.0-27.9, adult: Secondary | ICD-10-CM | POA: Diagnosis not present

## 2018-09-25 DIAGNOSIS — G894 Chronic pain syndrome: Secondary | ICD-10-CM | POA: Diagnosis not present

## 2018-10-02 ENCOUNTER — Encounter: Payer: Self-pay | Admitting: Cardiology

## 2018-10-05 ENCOUNTER — Ambulatory Visit: Payer: Medicare HMO | Admitting: Nurse Practitioner

## 2018-10-16 DIAGNOSIS — J329 Chronic sinusitis, unspecified: Secondary | ICD-10-CM | POA: Diagnosis not present

## 2018-10-16 DIAGNOSIS — Z6828 Body mass index (BMI) 28.0-28.9, adult: Secondary | ICD-10-CM | POA: Diagnosis not present

## 2018-10-16 DIAGNOSIS — R42 Dizziness and giddiness: Secondary | ICD-10-CM | POA: Diagnosis not present

## 2018-10-16 DIAGNOSIS — G894 Chronic pain syndrome: Secondary | ICD-10-CM | POA: Diagnosis not present

## 2018-10-16 DIAGNOSIS — E663 Overweight: Secondary | ICD-10-CM | POA: Diagnosis not present

## 2018-11-15 ENCOUNTER — Ambulatory Visit: Payer: Medicare HMO | Admitting: Nurse Practitioner

## 2018-11-21 DIAGNOSIS — L720 Epidermal cyst: Secondary | ICD-10-CM | POA: Diagnosis not present

## 2018-11-21 DIAGNOSIS — D1801 Hemangioma of skin and subcutaneous tissue: Secondary | ICD-10-CM | POA: Diagnosis not present

## 2018-11-21 DIAGNOSIS — L821 Other seborrheic keratosis: Secondary | ICD-10-CM | POA: Diagnosis not present

## 2018-11-21 DIAGNOSIS — D225 Melanocytic nevi of trunk: Secondary | ICD-10-CM | POA: Diagnosis not present

## 2018-11-30 DIAGNOSIS — G894 Chronic pain syndrome: Secondary | ICD-10-CM | POA: Diagnosis not present

## 2019-01-02 DIAGNOSIS — G894 Chronic pain syndrome: Secondary | ICD-10-CM | POA: Diagnosis not present

## 2019-01-03 ENCOUNTER — Ambulatory Visit: Payer: Medicare HMO | Admitting: Cardiology

## 2019-01-10 ENCOUNTER — Ambulatory Visit: Payer: Medicare HMO | Admitting: Urology

## 2019-01-23 ENCOUNTER — Ambulatory Visit: Payer: Medicare HMO | Admitting: Cardiology

## 2019-01-31 ENCOUNTER — Ambulatory Visit: Payer: Medicare HMO | Admitting: Cardiovascular Disease

## 2019-01-31 DIAGNOSIS — G894 Chronic pain syndrome: Secondary | ICD-10-CM | POA: Diagnosis not present

## 2019-02-13 DIAGNOSIS — R69 Illness, unspecified: Secondary | ICD-10-CM | POA: Diagnosis not present

## 2019-02-14 ENCOUNTER — Ambulatory Visit: Payer: Medicare HMO | Admitting: Nurse Practitioner

## 2019-02-21 DIAGNOSIS — G894 Chronic pain syndrome: Secondary | ICD-10-CM | POA: Diagnosis not present

## 2019-03-07 ENCOUNTER — Ambulatory Visit: Payer: Medicare HMO | Admitting: Cardiology

## 2019-03-18 DIAGNOSIS — W450XXA Nail entering through skin, initial encounter: Secondary | ICD-10-CM | POA: Diagnosis not present

## 2019-03-18 DIAGNOSIS — S91332A Puncture wound without foreign body, left foot, initial encounter: Secondary | ICD-10-CM | POA: Diagnosis not present

## 2019-03-18 DIAGNOSIS — M79672 Pain in left foot: Secondary | ICD-10-CM | POA: Diagnosis not present

## 2019-03-29 ENCOUNTER — Ambulatory Visit: Payer: Medicare HMO | Admitting: Nurse Practitioner

## 2019-04-06 DIAGNOSIS — G894 Chronic pain syndrome: Secondary | ICD-10-CM | POA: Diagnosis not present

## 2019-04-06 DIAGNOSIS — Z23 Encounter for immunization: Secondary | ICD-10-CM | POA: Diagnosis not present

## 2019-04-16 ENCOUNTER — Ambulatory Visit: Payer: Medicare HMO | Admitting: Cardiology

## 2019-04-19 DIAGNOSIS — G894 Chronic pain syndrome: Secondary | ICD-10-CM | POA: Diagnosis not present

## 2019-05-08 DIAGNOSIS — G894 Chronic pain syndrome: Secondary | ICD-10-CM | POA: Diagnosis not present

## 2019-05-28 ENCOUNTER — Ambulatory Visit: Payer: Medicare HMO | Admitting: Cardiology

## 2019-05-28 DIAGNOSIS — G894 Chronic pain syndrome: Secondary | ICD-10-CM | POA: Diagnosis not present

## 2019-06-20 ENCOUNTER — Ambulatory Visit: Payer: Medicare HMO | Admitting: Cardiology

## 2019-06-20 DIAGNOSIS — G894 Chronic pain syndrome: Secondary | ICD-10-CM | POA: Diagnosis not present

## 2019-06-20 NOTE — Progress Notes (Deleted)
     Clinical Summary Mr. Dower is a 68 y.o.male seen as new consult  1.    No past medical history on file.   Not on File   No current outpatient medications on file.   No current facility-administered medications for this visit.      *** The histories are not reviewed yet. Please review them in the "History" navigator section and refresh this [place].   Not on File    No family history on file.   Social History Mr. Brinkerhoff has no history on file for tobacco. Mr. Granade has no history on file for alcohol.   Review of Systems CONSTITUTIONAL: No weight loss, fever, chills, weakness or fatigue.  HEENT: Eyes: No visual loss, blurred vision, double vision or yellow sclerae.No hearing loss, sneezing, congestion, runny nose or sore throat.  SKIN: No rash or itching.  CARDIOVASCULAR:  RESPIRATORY: No shortness of breath, cough or sputum.  GASTROINTESTINAL: No anorexia, nausea, vomiting or diarrhea. No abdominal pain or blood.  GENITOURINARY: No burning on urination, no polyuria NEUROLOGICAL: No headache, dizziness, syncope, paralysis, ataxia, numbness or tingling in the extremities. No change in bowel or bladder control.  MUSCULOSKELETAL: No muscle, back pain, joint pain or stiffness.  LYMPHATICS: No enlarged nodes. No history of splenectomy.  PSYCHIATRIC: No history of depression or anxiety.  ENDOCRINOLOGIC: No reports of sweating, cold or heat intolerance. No polyuria or polydipsia.  Marland Kitchen   Physical Examination There were no vitals filed for this visit. There were no vitals filed for this visit.  Gen: resting comfortably, no acute distress HEENT: no scleral icterus, pupils equal round and reactive, no palptable cervical adenopathy,  CV Resp: Clear to auscultation bilaterally GI: abdomen is soft, non-tender, non-distended, normal bowel sounds, no hepatosplenomegaly MSK: extremities are warm, no edema.  Skin: warm, no rash Neuro:  no focal deficits Psych: appropriate  affect   Diagnostic Studies     Assessment and Plan        Arnoldo Lenis, M.D., F.A.C.C.

## 2019-07-03 DIAGNOSIS — Z85828 Personal history of other malignant neoplasm of skin: Secondary | ICD-10-CM | POA: Diagnosis not present

## 2019-07-03 DIAGNOSIS — G8929 Other chronic pain: Secondary | ICD-10-CM | POA: Diagnosis not present

## 2019-07-03 DIAGNOSIS — Z79891 Long term (current) use of opiate analgesic: Secondary | ICD-10-CM | POA: Diagnosis not present

## 2019-07-03 DIAGNOSIS — Z9181 History of falling: Secondary | ICD-10-CM | POA: Diagnosis not present

## 2019-07-03 DIAGNOSIS — E039 Hypothyroidism, unspecified: Secondary | ICD-10-CM | POA: Diagnosis not present

## 2019-07-03 DIAGNOSIS — K5900 Constipation, unspecified: Secondary | ICD-10-CM | POA: Diagnosis not present

## 2019-07-03 DIAGNOSIS — Z87891 Personal history of nicotine dependence: Secondary | ICD-10-CM | POA: Diagnosis not present

## 2019-07-03 DIAGNOSIS — M542 Cervicalgia: Secondary | ICD-10-CM | POA: Diagnosis not present

## 2019-07-13 DIAGNOSIS — G894 Chronic pain syndrome: Secondary | ICD-10-CM | POA: Diagnosis not present

## 2019-08-09 DIAGNOSIS — G894 Chronic pain syndrome: Secondary | ICD-10-CM | POA: Diagnosis not present

## 2019-08-20 ENCOUNTER — Telehealth: Payer: Self-pay

## 2019-08-20 NOTE — Telephone Encounter (Signed)
[facility]/access nurse faxed a note to [facility] that pt wants covid vaccine and request cb. Unable to reach pt on phone; no DPR signed. I called Dr Nolon Rod office at [phone] and spoke with Terrence Dupont and advised pt request and Terrence Dupont said she would contact pt and does not need fax of [facility] note. Sent  TH note for scanning.

## 2019-09-04 ENCOUNTER — Encounter: Payer: Self-pay | Admitting: Gastroenterology

## 2019-09-10 DIAGNOSIS — G894 Chronic pain syndrome: Secondary | ICD-10-CM | POA: Diagnosis not present

## 2019-09-10 DIAGNOSIS — F419 Anxiety disorder, unspecified: Secondary | ICD-10-CM | POA: Diagnosis not present

## 2019-09-10 DIAGNOSIS — F1120 Opioid dependence, uncomplicated: Secondary | ICD-10-CM | POA: Diagnosis not present

## 2019-09-10 DIAGNOSIS — E063 Autoimmune thyroiditis: Secondary | ICD-10-CM | POA: Diagnosis not present

## 2019-09-19 ENCOUNTER — Ambulatory Visit: Payer: Medicare HMO | Admitting: Gastroenterology

## 2019-10-06 IMAGING — CT CT ABD-PEL WO/W CM
3 of 12 series · 11 of 46 positions shown, 17 images · IV contrast (Isovue)
Comparison: None.

CLINICAL DATA: Microscopic hematuria

EXAM:
CT ABDOMEN AND PELVIS WITHOUT AND WITH CONTRAST
TECHNIQUE: Multidetector CT imaging of the abdomen and pelvis was performed
following the standard protocol before and following the bolus
administration of intravenous contrast.
CONTRAST:  125mL FAP6ZW-JII IOPAMIDOL (FAP6ZW-JII) INJECTION 61%

[Series 2: axial pre · axial · non-contrast · 0.81mm/px · z∈[-455,-125]mm · 7 of 89 slices shown, 12 images]
[im 12/89  soft-tissue]
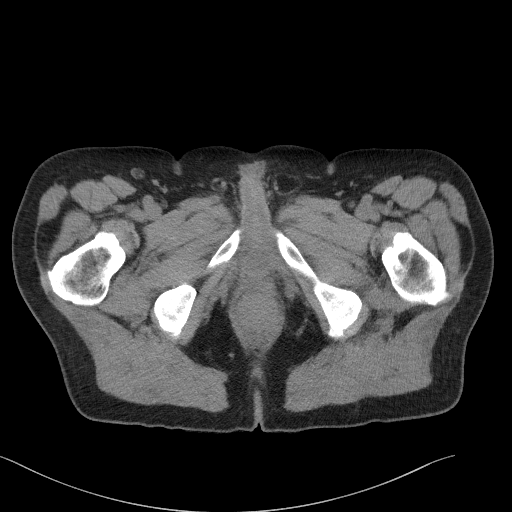
[im 12/89  bone]
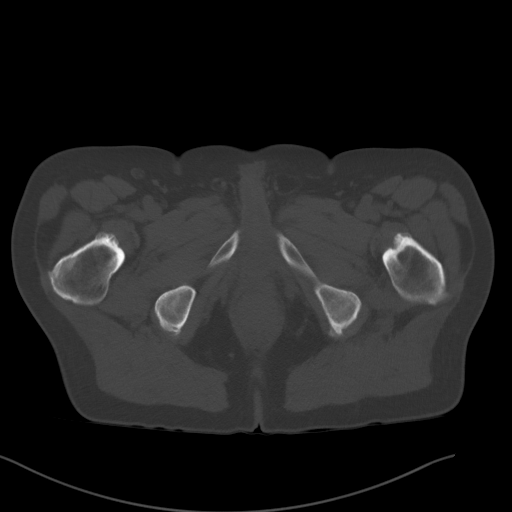
[im 23/89  soft-tissue]
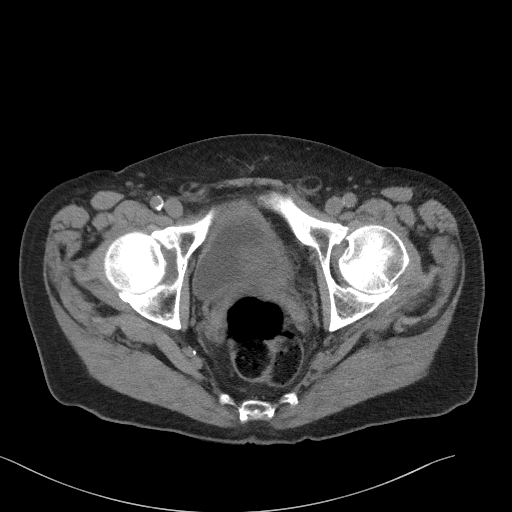
[im 34/89  soft-tissue]
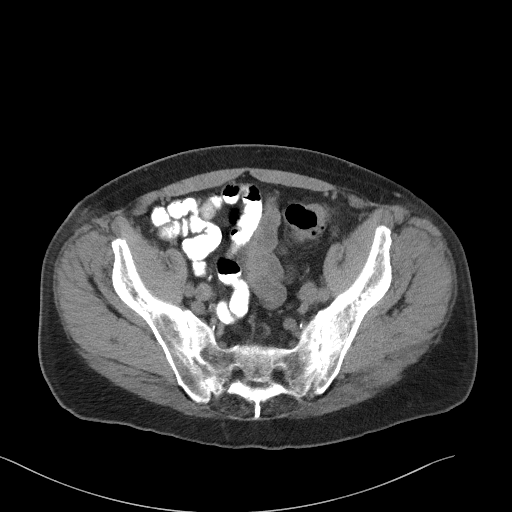
[im 45/89  soft-tissue]
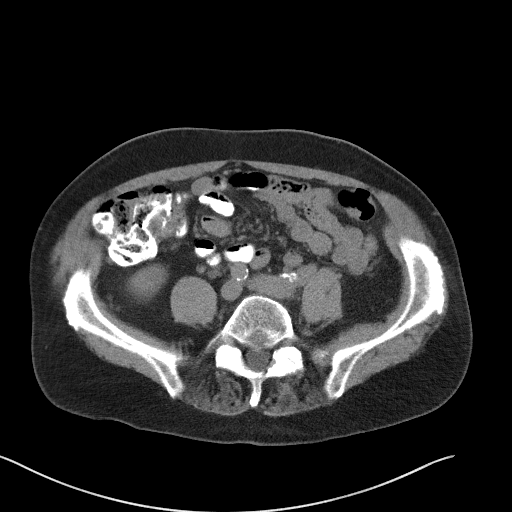
[im 45/89  lung]
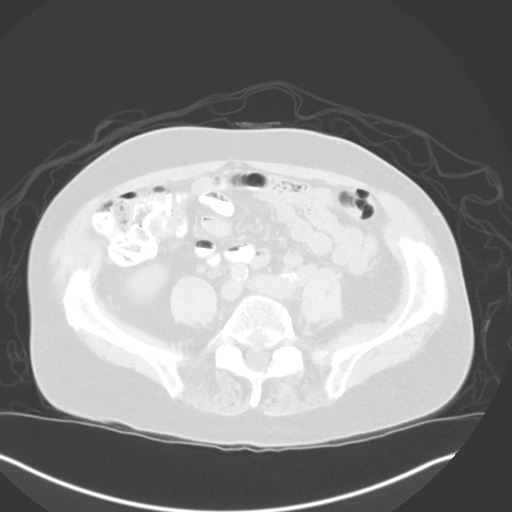
[im 56/89  soft-tissue]
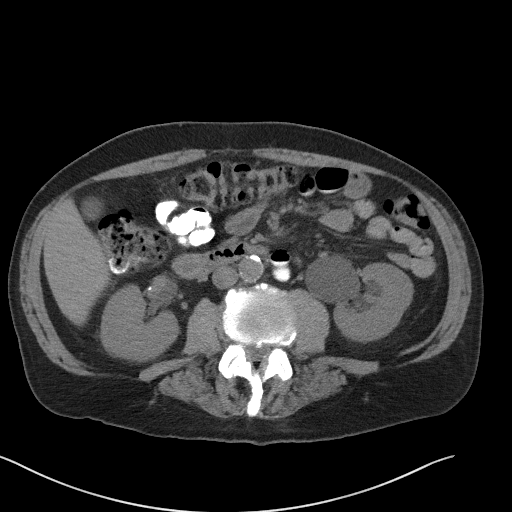
[im 56/89  lung]
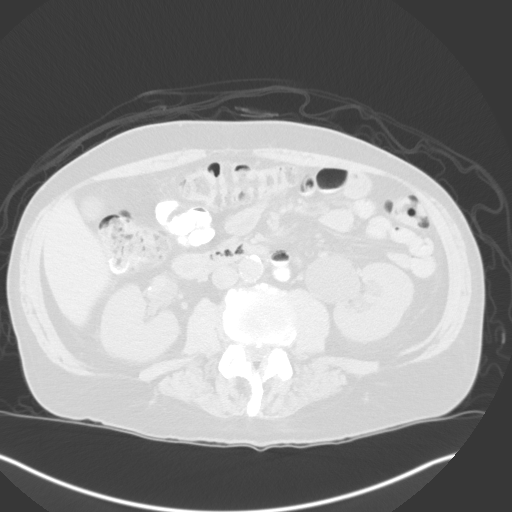
[im 67/89  soft-tissue]
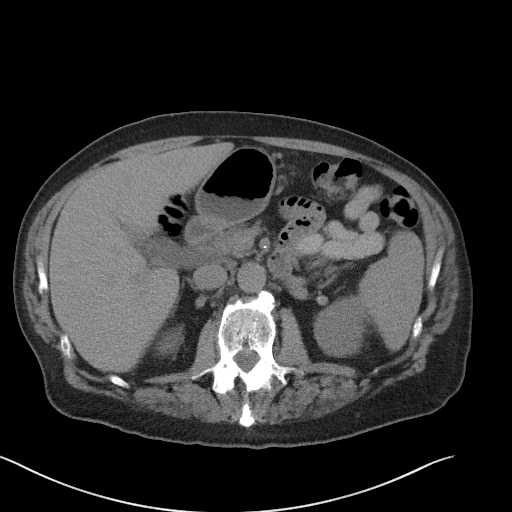
[im 67/89  lung]
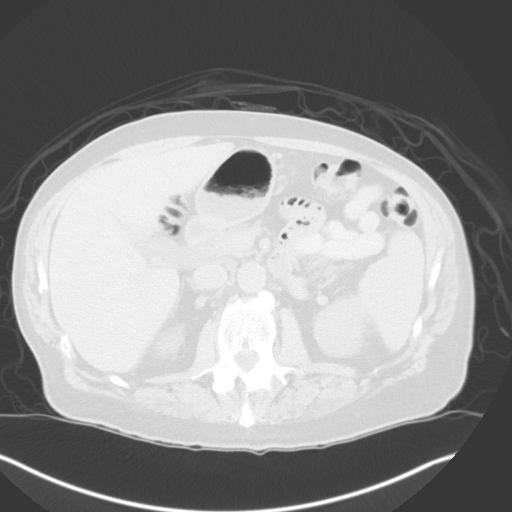
[im 78/89  soft-tissue]
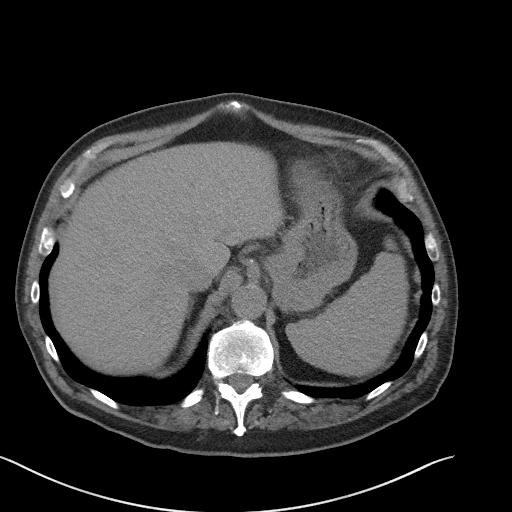
[im 78/89  lung]
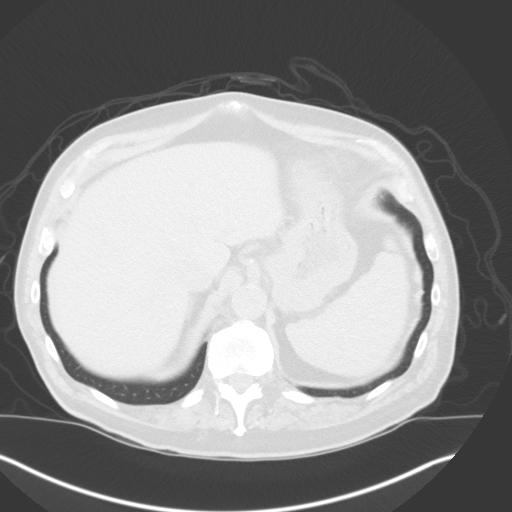

[Series 3: axial post · axial · 0.81mm/px · z∈[-455,-400]mm · 2 of 89 slices shown]
[im 12/89  soft-tissue]
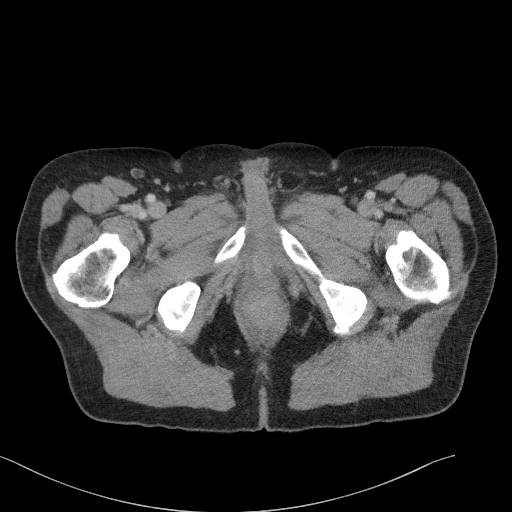
[im 23/89  soft-tissue]
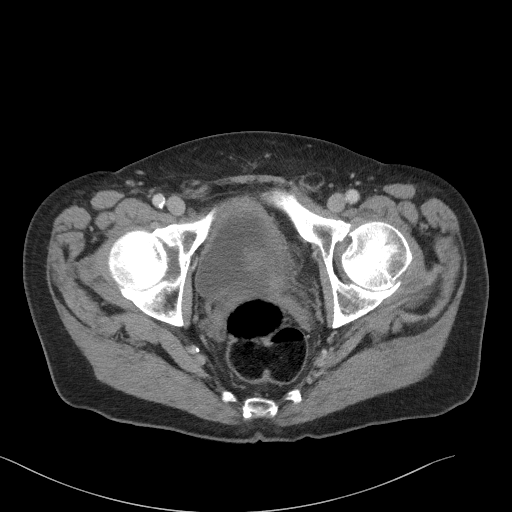

[Series 11: coronal pre · coronal · non-contrast · 0.67mm/px · 2 of 85 slices shown, 3 images]
[im 29/85  soft-tissue]
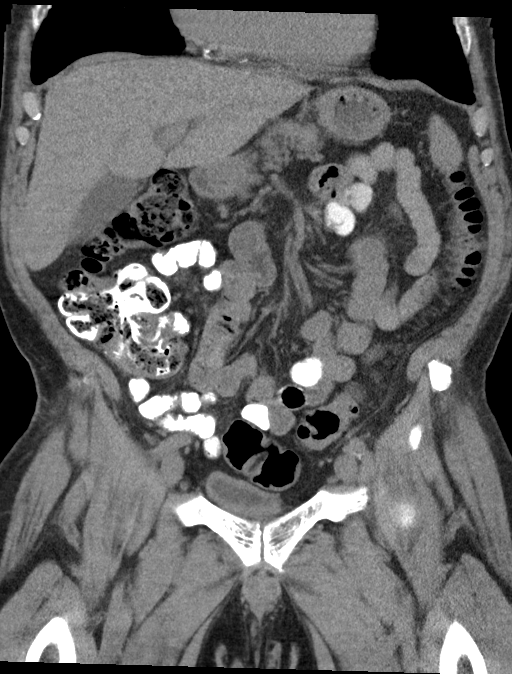
[im 29/85  bone]
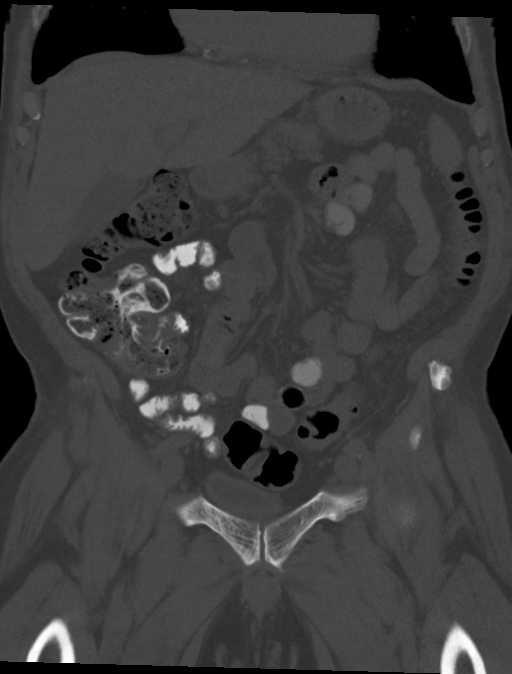
[im 57/85  soft-tissue]
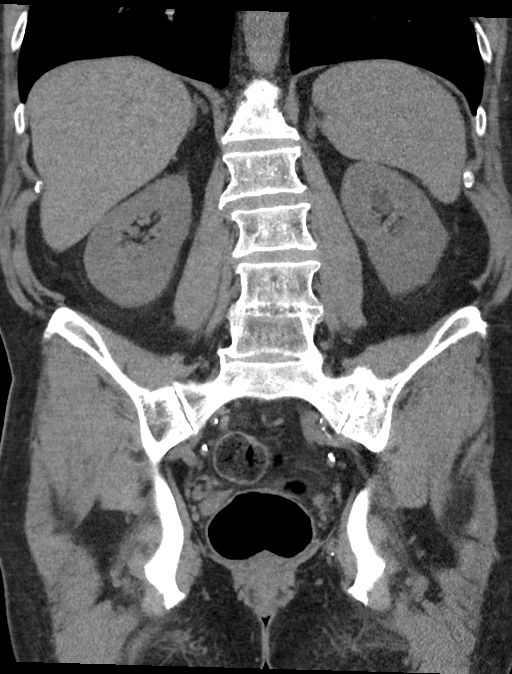

[11 of 46 positions shown; findings below may reference images not displayed]

FINDINGS: Lower chest: Right and circumflex coronary artery atherosclerotic
calcification

Hepatobiliary: Mild extrahepatic biliary dilatation with proximal
portions of the common bile duct up to 1.4 cm and distal portions up
to 0.9 cm, with a blunted distal margin of the common bile duct but
without a specific cause identified. No significant intrahepatic
biliary dilatation. The gallbladder appears unremarkable.

Pancreas: Unremarkable

Spleen: Upper normal splenic size.  Otherwise unremarkable.

Adrenals/Urinary Tract: The adrenal glands appear normal.
Nonobstructive bilateral nephrolithiasis with a 2 mm right kidney
upper pole nonobstructive calculus (image [DATE] and day 3 mm left
kidney upper pole nonobstructive renal calculus (image 53/11. There
are also vascular calcifications in both renal hila. There is left
hydronephrosis and dilated left collecting system but no hydroureter
or stone the UVJ, indicating UPJ stenosis. There is also some
narrowing at the right UPJ but without overt right hydronephrosis.

Mild lobularity of the prostate gland along the urinary bladder but
no other appreciable filling defect along the urothelium. No
abnormal enhancement along the urothelium.

Stomach/Bowel: Mild sigmoid colon diverticulosis without active
diverticulitis. Normal appendix.

Vascular/Lymphatic: Aortoiliac atherosclerotic vascular disease.

Reproductive: The prostate gland measures 5.0 by 4.5 by 5.5 cm
(volume = 65 cm^3).

Other: No supplemental non-categorized findings.

Musculoskeletal: Bridging spurring of both sacroiliac joints.
Interbody fusion also observed at L4-5. There is right foraminal
impingement at L3-4 primarily from facet spurring.
IMPRESSION: 1. Bilateral nonobstructive nephrolithiasis.
2. Mild left hydronephrosis which appears to be due to ureteropelvic
junction stenosis. No stones seen in this vicinity.
3. Mild prostatomegaly.
4. Extrahepatic biliary dilatation above in the common hepatic duct
and common bile duct up to 1.4 cm in diameter. No intrahepatic
dilatation. This could be from a low-grade distal
obstruction/stricture, an occult lesion of the CBD, or of a type 1
choledochal cyst. If the patient has abnormal bilirubin levels or if
otherwise clinically warranted, this could be further investigated
using ERCP or MRCP.
5. Other imaging findings of potential clinical significance: Aortic
Atherosclerosis ([id number]-[id number].X). Coronary atherosclerosis. Sigmoid
colon diverticulosis. Right foraminal impingement at L3-4.

## 2019-10-11 DIAGNOSIS — F419 Anxiety disorder, unspecified: Secondary | ICD-10-CM | POA: Diagnosis not present

## 2019-10-11 DIAGNOSIS — G894 Chronic pain syndrome: Secondary | ICD-10-CM | POA: Diagnosis not present

## 2019-10-23 DIAGNOSIS — E039 Hypothyroidism, unspecified: Secondary | ICD-10-CM | POA: Diagnosis not present

## 2019-11-12 DIAGNOSIS — G894 Chronic pain syndrome: Secondary | ICD-10-CM | POA: Diagnosis not present

## 2019-11-20 DIAGNOSIS — E039 Hypothyroidism, unspecified: Secondary | ICD-10-CM | POA: Diagnosis not present

## 2019-11-20 DIAGNOSIS — D225 Melanocytic nevi of trunk: Secondary | ICD-10-CM | POA: Diagnosis not present

## 2019-11-20 DIAGNOSIS — L821 Other seborrheic keratosis: Secondary | ICD-10-CM | POA: Diagnosis not present

## 2019-11-20 DIAGNOSIS — D2272 Melanocytic nevi of left lower limb, including hip: Secondary | ICD-10-CM | POA: Diagnosis not present

## 2019-11-20 DIAGNOSIS — D1801 Hemangioma of skin and subcutaneous tissue: Secondary | ICD-10-CM | POA: Diagnosis not present

## 2019-12-11 DIAGNOSIS — G894 Chronic pain syndrome: Secondary | ICD-10-CM | POA: Diagnosis not present

## 2019-12-11 DIAGNOSIS — F419 Anxiety disorder, unspecified: Secondary | ICD-10-CM | POA: Diagnosis not present

## 2019-12-11 DIAGNOSIS — E063 Autoimmune thyroiditis: Secondary | ICD-10-CM | POA: Diagnosis not present

## 2019-12-11 DIAGNOSIS — M5412 Radiculopathy, cervical region: Secondary | ICD-10-CM | POA: Diagnosis not present

## 2020-01-04 DIAGNOSIS — F419 Anxiety disorder, unspecified: Secondary | ICD-10-CM | POA: Diagnosis not present

## 2020-01-04 DIAGNOSIS — G894 Chronic pain syndrome: Secondary | ICD-10-CM | POA: Diagnosis not present

## 2020-01-04 DIAGNOSIS — E063 Autoimmune thyroiditis: Secondary | ICD-10-CM | POA: Diagnosis not present

## 2020-02-21 DIAGNOSIS — H905 Unspecified sensorineural hearing loss: Secondary | ICD-10-CM | POA: Diagnosis not present

## 2020-02-28 DIAGNOSIS — E6609 Other obesity due to excess calories: Secondary | ICD-10-CM | POA: Diagnosis not present

## 2020-02-28 DIAGNOSIS — G894 Chronic pain syndrome: Secondary | ICD-10-CM | POA: Diagnosis not present

## 2020-02-28 DIAGNOSIS — Z6831 Body mass index (BMI) 31.0-31.9, adult: Secondary | ICD-10-CM | POA: Diagnosis not present

## 2020-02-28 DIAGNOSIS — Z0000 Encounter for general adult medical examination without abnormal findings: Secondary | ICD-10-CM | POA: Diagnosis not present

## 2020-02-28 DIAGNOSIS — F419 Anxiety disorder, unspecified: Secondary | ICD-10-CM | POA: Diagnosis not present

## 2020-02-28 DIAGNOSIS — E063 Autoimmune thyroiditis: Secondary | ICD-10-CM | POA: Diagnosis not present

## 2020-02-28 DIAGNOSIS — Z1389 Encounter for screening for other disorder: Secondary | ICD-10-CM | POA: Diagnosis not present

## 2020-02-28 DIAGNOSIS — Z0001 Encounter for general adult medical examination with abnormal findings: Secondary | ICD-10-CM | POA: Diagnosis not present

## 2020-03-26 DIAGNOSIS — G894 Chronic pain syndrome: Secondary | ICD-10-CM | POA: Diagnosis not present

## 2020-04-17 DIAGNOSIS — H52223 Regular astigmatism, bilateral: Secondary | ICD-10-CM | POA: Diagnosis not present

## 2020-05-02 DIAGNOSIS — Z6831 Body mass index (BMI) 31.0-31.9, adult: Secondary | ICD-10-CM | POA: Diagnosis not present

## 2020-05-02 DIAGNOSIS — E063 Autoimmune thyroiditis: Secondary | ICD-10-CM | POA: Diagnosis not present

## 2020-05-02 DIAGNOSIS — E6609 Other obesity due to excess calories: Secondary | ICD-10-CM | POA: Diagnosis not present

## 2020-05-02 DIAGNOSIS — G894 Chronic pain syndrome: Secondary | ICD-10-CM | POA: Diagnosis not present

## 2020-06-13 DIAGNOSIS — G894 Chronic pain syndrome: Secondary | ICD-10-CM | POA: Diagnosis not present

## 2020-06-13 DIAGNOSIS — E063 Autoimmune thyroiditis: Secondary | ICD-10-CM | POA: Diagnosis not present

## 2020-06-20 DIAGNOSIS — G894 Chronic pain syndrome: Secondary | ICD-10-CM | POA: Diagnosis not present

## 2020-06-20 DIAGNOSIS — E6609 Other obesity due to excess calories: Secondary | ICD-10-CM | POA: Diagnosis not present

## 2020-06-20 DIAGNOSIS — Z7689 Persons encountering health services in other specified circumstances: Secondary | ICD-10-CM | POA: Diagnosis not present

## 2020-06-20 DIAGNOSIS — Z6832 Body mass index (BMI) 32.0-32.9, adult: Secondary | ICD-10-CM | POA: Diagnosis not present

## 2020-07-04 DIAGNOSIS — Z6833 Body mass index (BMI) 33.0-33.9, adult: Secondary | ICD-10-CM | POA: Diagnosis not present

## 2020-07-04 DIAGNOSIS — G894 Chronic pain syndrome: Secondary | ICD-10-CM | POA: Diagnosis not present

## 2020-07-04 DIAGNOSIS — E6609 Other obesity due to excess calories: Secondary | ICD-10-CM | POA: Diagnosis not present

## 2020-07-07 DIAGNOSIS — F1120 Opioid dependence, uncomplicated: Secondary | ICD-10-CM | POA: Diagnosis not present

## 2020-07-07 DIAGNOSIS — G894 Chronic pain syndrome: Secondary | ICD-10-CM | POA: Diagnosis not present

## 2020-07-07 DIAGNOSIS — Z6833 Body mass index (BMI) 33.0-33.9, adult: Secondary | ICD-10-CM | POA: Diagnosis not present

## 2020-07-07 DIAGNOSIS — E6609 Other obesity due to excess calories: Secondary | ICD-10-CM | POA: Diagnosis not present

## 2020-08-13 DIAGNOSIS — G894 Chronic pain syndrome: Secondary | ICD-10-CM | POA: Diagnosis not present

## 2020-08-13 DIAGNOSIS — E063 Autoimmune thyroiditis: Secondary | ICD-10-CM | POA: Diagnosis not present

## 2020-09-01 DIAGNOSIS — F1120 Opioid dependence, uncomplicated: Secondary | ICD-10-CM | POA: Diagnosis not present

## 2020-09-01 DIAGNOSIS — E063 Autoimmune thyroiditis: Secondary | ICD-10-CM | POA: Diagnosis not present

## 2020-09-01 DIAGNOSIS — Z1331 Encounter for screening for depression: Secondary | ICD-10-CM | POA: Diagnosis not present

## 2020-09-01 DIAGNOSIS — G894 Chronic pain syndrome: Secondary | ICD-10-CM | POA: Diagnosis not present

## 2020-09-01 DIAGNOSIS — Z6833 Body mass index (BMI) 33.0-33.9, adult: Secondary | ICD-10-CM | POA: Diagnosis not present

## 2020-09-15 DIAGNOSIS — G4737 Central sleep apnea in conditions classified elsewhere: Secondary | ICD-10-CM | POA: Diagnosis not present

## 2020-09-15 DIAGNOSIS — G2579 Other drug induced movement disorders: Secondary | ICD-10-CM | POA: Diagnosis not present

## 2020-09-15 DIAGNOSIS — G4733 Obstructive sleep apnea (adult) (pediatric): Secondary | ICD-10-CM | POA: Diagnosis not present

## 2020-09-15 DIAGNOSIS — G894 Chronic pain syndrome: Secondary | ICD-10-CM | POA: Diagnosis not present

## 2020-09-15 DIAGNOSIS — G253 Myoclonus: Secondary | ICD-10-CM | POA: Diagnosis not present

## 2020-09-15 DIAGNOSIS — Z79899 Other long term (current) drug therapy: Secondary | ICD-10-CM | POA: Diagnosis not present

## 2020-09-22 DIAGNOSIS — G894 Chronic pain syndrome: Secondary | ICD-10-CM | POA: Diagnosis not present

## 2020-09-22 DIAGNOSIS — E6609 Other obesity due to excess calories: Secondary | ICD-10-CM | POA: Diagnosis not present

## 2020-09-22 DIAGNOSIS — Z6834 Body mass index (BMI) 34.0-34.9, adult: Secondary | ICD-10-CM | POA: Diagnosis not present

## 2020-09-22 DIAGNOSIS — E063 Autoimmune thyroiditis: Secondary | ICD-10-CM | POA: Diagnosis not present

## 2020-10-03 DIAGNOSIS — E063 Autoimmune thyroiditis: Secondary | ICD-10-CM | POA: Diagnosis not present

## 2020-10-03 DIAGNOSIS — G894 Chronic pain syndrome: Secondary | ICD-10-CM | POA: Diagnosis not present

## 2020-10-03 DIAGNOSIS — Z789 Other specified health status: Secondary | ICD-10-CM | POA: Diagnosis not present

## 2020-10-22 DIAGNOSIS — G894 Chronic pain syndrome: Secondary | ICD-10-CM | POA: Diagnosis not present

## 2020-11-13 DIAGNOSIS — E063 Autoimmune thyroiditis: Secondary | ICD-10-CM | POA: Diagnosis not present

## 2020-11-13 DIAGNOSIS — F419 Anxiety disorder, unspecified: Secondary | ICD-10-CM | POA: Diagnosis not present

## 2020-11-13 DIAGNOSIS — G894 Chronic pain syndrome: Secondary | ICD-10-CM | POA: Diagnosis not present

## 2020-11-25 DIAGNOSIS — G894 Chronic pain syndrome: Secondary | ICD-10-CM | POA: Diagnosis not present

## 2020-11-25 DIAGNOSIS — Z789 Other specified health status: Secondary | ICD-10-CM | POA: Diagnosis not present

## 2020-11-25 DIAGNOSIS — Z6834 Body mass index (BMI) 34.0-34.9, adult: Secondary | ICD-10-CM | POA: Diagnosis not present

## 2020-11-25 DIAGNOSIS — E6609 Other obesity due to excess calories: Secondary | ICD-10-CM | POA: Diagnosis not present

## 2020-12-03 DIAGNOSIS — L821 Other seborrheic keratosis: Secondary | ICD-10-CM | POA: Diagnosis not present

## 2020-12-03 DIAGNOSIS — L570 Actinic keratosis: Secondary | ICD-10-CM | POA: Diagnosis not present

## 2020-12-03 DIAGNOSIS — L720 Epidermal cyst: Secondary | ICD-10-CM | POA: Diagnosis not present

## 2020-12-03 DIAGNOSIS — D485 Neoplasm of uncertain behavior of skin: Secondary | ICD-10-CM | POA: Diagnosis not present

## 2020-12-03 DIAGNOSIS — D225 Melanocytic nevi of trunk: Secondary | ICD-10-CM | POA: Diagnosis not present

## 2021-01-09 ENCOUNTER — Other Ambulatory Visit (HOSPITAL_BASED_OUTPATIENT_CLINIC_OR_DEPARTMENT_OTHER): Payer: Self-pay

## 2021-01-09 DIAGNOSIS — G4733 Obstructive sleep apnea (adult) (pediatric): Secondary | ICD-10-CM

## 2021-10-16 DIAGNOSIS — M459 Ankylosing spondylitis of unspecified sites in spine: Secondary | ICD-10-CM | POA: Diagnosis not present

## 2021-10-16 DIAGNOSIS — B1920 Unspecified viral hepatitis C without hepatic coma: Secondary | ICD-10-CM | POA: Diagnosis not present

## 2021-10-16 DIAGNOSIS — G8929 Other chronic pain: Secondary | ICD-10-CM | POA: Diagnosis not present

## 2021-10-16 DIAGNOSIS — Z6833 Body mass index (BMI) 33.0-33.9, adult: Secondary | ICD-10-CM | POA: Diagnosis not present

## 2021-10-16 DIAGNOSIS — E6609 Other obesity due to excess calories: Secondary | ICD-10-CM | POA: Diagnosis not present

## 2021-10-16 DIAGNOSIS — Z0000 Encounter for general adult medical examination without abnormal findings: Secondary | ICD-10-CM | POA: Diagnosis not present

## 2021-10-16 DIAGNOSIS — Z1331 Encounter for screening for depression: Secondary | ICD-10-CM | POA: Diagnosis not present

## 2021-10-19 ENCOUNTER — Encounter (INDEPENDENT_AMBULATORY_CARE_PROVIDER_SITE_OTHER): Payer: Self-pay | Admitting: *Deleted

## 2021-10-26 DIAGNOSIS — Z6833 Body mass index (BMI) 33.0-33.9, adult: Secondary | ICD-10-CM | POA: Diagnosis not present

## 2021-10-26 DIAGNOSIS — M459 Ankylosing spondylitis of unspecified sites in spine: Secondary | ICD-10-CM | POA: Diagnosis not present

## 2021-10-26 DIAGNOSIS — B1920 Unspecified viral hepatitis C without hepatic coma: Secondary | ICD-10-CM | POA: Diagnosis not present

## 2021-10-26 DIAGNOSIS — G8929 Other chronic pain: Secondary | ICD-10-CM | POA: Diagnosis not present

## 2021-11-05 DIAGNOSIS — Z6833 Body mass index (BMI) 33.0-33.9, adult: Secondary | ICD-10-CM | POA: Diagnosis not present

## 2021-11-05 DIAGNOSIS — E6609 Other obesity due to excess calories: Secondary | ICD-10-CM | POA: Diagnosis not present

## 2021-11-05 DIAGNOSIS — G8929 Other chronic pain: Secondary | ICD-10-CM | POA: Diagnosis not present

## 2021-11-17 DIAGNOSIS — M459 Ankylosing spondylitis of unspecified sites in spine: Secondary | ICD-10-CM | POA: Diagnosis not present

## 2021-11-17 DIAGNOSIS — G8929 Other chronic pain: Secondary | ICD-10-CM | POA: Diagnosis not present

## 2021-11-17 DIAGNOSIS — B1920 Unspecified viral hepatitis C without hepatic coma: Secondary | ICD-10-CM | POA: Diagnosis not present

## 2021-11-30 ENCOUNTER — Encounter (INDEPENDENT_AMBULATORY_CARE_PROVIDER_SITE_OTHER): Payer: Self-pay | Admitting: Gastroenterology

## 2021-11-30 ENCOUNTER — Ambulatory Visit (INDEPENDENT_AMBULATORY_CARE_PROVIDER_SITE_OTHER): Payer: Medicare HMO | Admitting: Gastroenterology

## 2021-11-30 VITALS — BP 118/59 | HR 54 | Temp 98.3°F | Ht 74.0 in | Wt 254.0 lb

## 2021-11-30 DIAGNOSIS — B1920 Unspecified viral hepatitis C without hepatic coma: Secondary | ICD-10-CM | POA: Insufficient documentation

## 2021-11-30 NOTE — Patient Instructions (Signed)
I will order a few more labs to be completed prior to starting Hep C treatment, as well as obtain imaging of the liver to rule out cirrhosis. Once I have these results, I will be in touch regarding treatment. ?Please consider having colonoscopy, let me know if you would like to proceed with this. ?

## 2021-11-30 NOTE — Progress Notes (Signed)
? ?Referring Provider: Redmond School, MD ?Primary Care Physician:  Redmond School, MD ?Primary GI Physician: new ? ?Chief Complaint  ?Patient presents with  ? Hepatitis C  ?  Patient arrives as a new patient. Referred for Hep C.   ? ?HPI:   ?Francisco Burns is a 71 y.o. male with past medical history of Hepatitis C, Skin Cancer, atherosclerosis, Hypothyroidism, chronic pain syndrome. ? ?Patient presenting today as a new patient for Hepatitis C. ? ?Recent labs done in February 2023 with normal LFTs: AST 24, ALT 38, T bili 0.4, ALk PHos 86 ?Hep C quant 257, 074, Hep C antibody Reactive, Hep b antibody and Hep b antigen both non reactive, no HIV testing or liver imaging done. ? ?Patient reports that he was in [place], went out there to be with family. States that he was maintained on oxycodone for chronic pain, while out there he was transitioned off of his pain meds with suboxone, had labs done at that time and was diagnosed with Hepatitis C. Denies any known hx of previous hepatitis diagnosis. Denies any current or previous IVDU, has used marijuana in the past but no other illicit drugs. Denies any previous blood transfusions in the past. Has no tattoos. States that he previously drank alcohol, last drink was around 15 years ago. At that time he was drinking a few beers per day, though at times he could drink up to 24 in a day. currently Taking oxycodone up to 6x/day for chronic neck pain. ? ?Has never had a colonoscopy in the past, he tells me he has had rectal bleeding intermittently x35-40 years. Reports hx of constipation bc of chronic pain meds, takes miralax PRN, has a BM every couple of days. Sometimes sees a line of blood on his stool, occasionally toilet tissue hematochoezia with times of worsening constipation. Denies itching burning or rectal pain. Denies abdominal pain, weight loss, changes in appetite. Does not think he has any family hx of CRC.  ? ?NSAID use:no ?Social hx:no current etoh, 1 PPD  cigarettes ?Fam hx: no crc or liver disease that he is aware of ? ?Last Colonoscopy:never ?Last Endoscopy:never ? ?Recommendations:  ?Initial screening colonoscopy ? ?Past Medical History:  ?Diagnosis Date  ? Hepatitis C   ? Skin cancer   ? Thyroid condition   ? ? ?Past Surgical History:  ?Procedure Laterality Date  ? BACK SURGERY    ? TONSILLECTOMY AND ADENOIDECTOMY    ? ? ?Current Outpatient Medications  ?Medication Sig Dispense Refill  ? levothyroxine (SYNTHROID) 25 MCG tablet Take 25 mcg by mouth daily before breakfast.    ? morphine (MS CONTIN) 30 MG 12 hr tablet Take by mouth. Takes every 4 hours    ? Multiple Vitamin (MULTIVITAMIN) tablet Take 1 tablet by mouth daily.    ? ?No current facility-administered medications for this visit.  ? ? ?Allergies as of 11/30/2021  ? (No Known Allergies)  ? ? ?History reviewed. No pertinent family history. ? ?Social History  ? ?Socioeconomic History  ? Marital status: Married  ?  Spouse name: Not on file  ? Number of children: Not on file  ? Years of education: Not on file  ? Highest education level: Not on file  ?Occupational History  ? Not on file  ?Tobacco Use  ? Smoking status: Former  ?  Types: E-cigarettes  ?  Passive exposure: Current  ? Smokeless tobacco: Never  ?Substance and Sexual Activity  ? Alcohol use: Not Currently  ? Drug  use: Never  ? Sexual activity: Not on file  ?Other Topics Concern  ? Not on file  ?Social History Narrative  ? Not on file  ? ?Social Determinants of Health  ? ?Financial Resource Strain: Not on file  ?Food Insecurity: Not on file  ?Transportation Needs: Not on file  ?Physical Activity: Not on file  ?Stress: Not on file  ?Social Connections: Not on file  ? ?Review of systems ?General: negative for malaise, night sweats, fever, chills, weight loss ?Neck: Negative for lumps, goiter, pain and significant neck swelling ?Resp: Negative for cough, wheezing, dyspnea at rest ?CV: Negative for chest pain, leg swelling, palpitations, orthopnea ?GI:  denies melena, nausea, vomiting, diarrhea, dysphagia, odyonophagia, early satiety or unintentional weight loss. +intermittent hematochezia ?MSK: Negative for joint pain or swelling, back pain, and muscle pain. ?Derm: Negative for itching or rash ?Psych: Denies depression, anxiety, memory loss, confusion. No homicidal or suicidal ideation.  ?Heme: Negative for prolonged bleeding, bruising easily, and swollen nodes. ?Endocrine: Negative for cold or heat intolerance, polyuria, polydipsia and goiter. ?Neuro: negative for tremor, gait imbalance, syncope and seizures. ?The remainder of the review of systems is noncontributory. ? ?Physical Exam: ?BP (!) 118/59 (BP Location: Left Arm, Patient Position: Sitting, Cuff Size: Large)   Pulse (!) 54   Temp 98.3 ?F (36.8 ?C) (Oral)   Ht $R'6\' 2"'Zg$  (1.88 m)   Wt 254 lb (115.2 kg)   BMI 32.61 kg/m?  ?General:   Alert and oriented. No distress noted. Pleasant and cooperative.  ?Head:  Normocephalic and atraumatic. ?Eyes:  Conjuctiva clear without scleral icterus. ?Mouth:  Oral mucosa pink and moist. Good dentition. No lesions. ?Heart: Normal rate and rhythm, s1 and s2 heart sounds present.  ?Lungs: Clear lung sounds in all lobes. Respirations equal and unlabored. ?Abdomen:  +BS, soft, non-tender and non-distended. No rebound or guarding. No HSM or masses noted. ?Derm: No palmar erythema or jaundice ?Msk:  Symmetrical without gross deformities. Normal posture. ?Extremities:  Without edema. ?Neurologic:  Alert and  oriented x4 ?Psych:  Alert and cooperative. Normal mood and affect. ? ?Invalid input(s): 6 MONTHS  ? ?ASSESSMENT: ?Francisco Burns is a 71 y.o. male presenting today as a new patient for Hepatitis C, diagnosed in February 2023. ? ?Newly diagnosed Hep C during routine labs, patient denies any previous illicit drug use besides marijuana, no previous blood transfusions or tattoos. Unsure how he acquired Hepatitis C. Discussed that further lab evaluation is needed along with  liver elastography to rule out liver cirrhosis, prior to proceeding with treatment.  ? ?In regards to [facility] screening, patient has never undergone colonoscopy in the past, reports intermittent rectal bleeding x30-45 years, usually with worsening bouts of constipation secondary to chronic opiate pain medication. I discussed importance of CRC screening via colonoscopy as this is the only definitive way to rule out any underlying CRC or possible colon polyps that could potentially evolve into a cancer, patient is somewhat amenable to this and states he will highly consider having a colonoscopy in near future. Will make me aware once he is ready to proceed.  ? ? ?PLAN:  ?HIV testing ?2. US Liver Elastography  ?3. Hep C genotype, Hep B core Ab ?4. UDS ?5. Further recommendations for Hep C treatment to follow pending labs/US results ?6. Pt to consider CRC screening via colonoscopy, will make me aware if he wishes to proceed ? ? ?Follow Up: ?3 months ? ?Jacek Colson L. Alver Sorrow, MSN, APRN, AGNP-C ?Adult-Gerontology Nurse Practitioner ?[facility] ? ?

## 2021-12-03 DIAGNOSIS — B1920 Unspecified viral hepatitis C without hepatic coma: Secondary | ICD-10-CM | POA: Diagnosis not present

## 2021-12-03 DIAGNOSIS — Z1159 Encounter for screening for other viral diseases: Secondary | ICD-10-CM | POA: Diagnosis not present

## 2021-12-03 DIAGNOSIS — Z0283 Encounter for blood-alcohol and blood-drug test: Secondary | ICD-10-CM | POA: Diagnosis not present

## 2021-12-03 DIAGNOSIS — Z114 Encounter for screening for human immunodeficiency virus [HIV]: Secondary | ICD-10-CM | POA: Diagnosis not present

## 2021-12-05 LAB — DRUG SCREEN, URINE
Amphetamines, Urine: NEGATIVE ng/mL
Barbiturate screen, urine: NEGATIVE ng/mL
Benzodiazepine Quant, Ur: NEGATIVE ng/mL
Cannabinoid Quant, Ur: NEGATIVE ng/mL
Cocaine (Metab.): NEGATIVE ng/mL
Opiate Quant, Ur: POSITIVE ng/mL — AB
PCP Quant, Ur: NEGATIVE ng/mL

## 2021-12-05 LAB — HEPATITIS C GENOTYPE

## 2021-12-05 LAB — HIV ANTIBODY (ROUTINE TESTING W REFLEX): HIV Screen 4th Generation wRfx: NONREACTIVE

## 2021-12-05 LAB — HEPATITIS B CORE ANTIBODY, TOTAL: Hep B Core Total Ab: NEGATIVE

## 2021-12-14 ENCOUNTER — Ambulatory Visit (HOSPITAL_COMMUNITY)
Admission: RE | Admit: 2021-12-14 | Discharge: 2021-12-14 | Disposition: A | Discharge status: Home / Self Care | Payer: Medicare HMO | Source: Ambulatory Visit | Attending: Gastroenterology | Admitting: Gastroenterology

## 2021-12-14 DIAGNOSIS — B1920 Unspecified viral hepatitis C without hepatic coma: Secondary | ICD-10-CM | POA: Insufficient documentation

## 2021-12-14 DIAGNOSIS — B182 Chronic viral hepatitis C: Secondary | ICD-10-CM | POA: Diagnosis not present

## 2021-12-14 DIAGNOSIS — K8689 Other specified diseases of pancreas: Secondary | ICD-10-CM | POA: Diagnosis not present

## 2021-12-15 ENCOUNTER — Other Ambulatory Visit (INDEPENDENT_AMBULATORY_CARE_PROVIDER_SITE_OTHER): Payer: Self-pay

## 2021-12-15 ENCOUNTER — Other Ambulatory Visit (INDEPENDENT_AMBULATORY_CARE_PROVIDER_SITE_OTHER): Payer: Self-pay | Admitting: Gastroenterology

## 2021-12-15 DIAGNOSIS — B1920 Unspecified viral hepatitis C without hepatic coma: Secondary | ICD-10-CM

## 2021-12-15 DIAGNOSIS — G8929 Other chronic pain: Secondary | ICD-10-CM | POA: Diagnosis not present

## 2021-12-15 DIAGNOSIS — M459 Ankylosing spondylitis of unspecified sites in spine: Secondary | ICD-10-CM | POA: Diagnosis not present

## 2021-12-15 MED ORDER — SOFOSBUVIR-VELPATASVIR 400-100 MG PO TABS: 1.0000 | ORAL_TABLET | Freq: Every day | ORAL | 2 refills | Status: DC

## 2021-12-30 DIAGNOSIS — D1801 Hemangioma of skin and subcutaneous tissue: Secondary | ICD-10-CM | POA: Diagnosis not present

## 2021-12-30 DIAGNOSIS — L821 Other seborrheic keratosis: Secondary | ICD-10-CM | POA: Diagnosis not present

## 2021-12-30 DIAGNOSIS — D225 Melanocytic nevi of trunk: Secondary | ICD-10-CM | POA: Diagnosis not present

## 2021-12-30 DIAGNOSIS — L218 Other seborrheic dermatitis: Secondary | ICD-10-CM | POA: Diagnosis not present

## 2021-12-30 DIAGNOSIS — D2272 Melanocytic nevi of left lower limb, including hip: Secondary | ICD-10-CM | POA: Diagnosis not present

## 2021-12-30 DIAGNOSIS — D692 Other nonthrombocytopenic purpura: Secondary | ICD-10-CM | POA: Diagnosis not present

## 2022-01-15 DIAGNOSIS — M459 Ankylosing spondylitis of unspecified sites in spine: Secondary | ICD-10-CM | POA: Diagnosis not present

## 2022-01-15 DIAGNOSIS — I700 Atherosclerosis of aorta: Secondary | ICD-10-CM | POA: Diagnosis not present

## 2022-01-15 DIAGNOSIS — Z6835 Body mass index (BMI) 35.0-35.9, adult: Secondary | ICD-10-CM | POA: Diagnosis not present

## 2022-01-15 DIAGNOSIS — G8929 Other chronic pain: Secondary | ICD-10-CM | POA: Diagnosis not present

## 2022-01-15 DIAGNOSIS — F1120 Opioid dependence, uncomplicated: Secondary | ICD-10-CM | POA: Diagnosis not present

## 2022-01-15 DIAGNOSIS — E6609 Other obesity due to excess calories: Secondary | ICD-10-CM | POA: Diagnosis not present

## 2022-01-15 DIAGNOSIS — B1920 Unspecified viral hepatitis C without hepatic coma: Secondary | ICD-10-CM | POA: Diagnosis not present

## 2022-01-15 DIAGNOSIS — M179 Osteoarthritis of knee, unspecified: Secondary | ICD-10-CM | POA: Diagnosis not present

## 2022-01-18 ENCOUNTER — Telehealth (INDEPENDENT_AMBULATORY_CARE_PROVIDER_SITE_OTHER): Payer: Self-pay | Admitting: Gastroenterology

## 2022-01-18 NOTE — Telephone Encounter (Signed)
I called [facility] and was told the refill is ready to go out and they just needed to set up delivery with patient. She said she would have patient care coordinator call patient and that he could also call [phone] and hit option 1 for refill. I called and gave patient this information. He verbalized understanding to call them if he has not heard from them today.

## 2022-01-18 NOTE — Telephone Encounter (Signed)
Pt said he would be out of his Hep C meds on Thursday. Please call him at [phone]

## 2022-01-18 NOTE — Telephone Encounter (Signed)
Called patient because he should have refills at pharmacy. He states he has taken for one month and will take last pill on Thursday. Does not have any for Friday. I told patient I would call pharmacy to see why they have not sent out his med.

## 2022-01-20 ENCOUNTER — Encounter (INDEPENDENT_AMBULATORY_CARE_PROVIDER_SITE_OTHER): Payer: Self-pay | Admitting: Gastroenterology

## 2022-01-20 ENCOUNTER — Encounter (INDEPENDENT_AMBULATORY_CARE_PROVIDER_SITE_OTHER): Payer: Self-pay | Admitting: *Deleted

## 2022-02-03 DIAGNOSIS — G8929 Other chronic pain: Secondary | ICD-10-CM | POA: Diagnosis not present

## 2022-02-03 DIAGNOSIS — B1920 Unspecified viral hepatitis C without hepatic coma: Secondary | ICD-10-CM | POA: Diagnosis not present

## 2022-02-03 DIAGNOSIS — M459 Ankylosing spondylitis of unspecified sites in spine: Secondary | ICD-10-CM | POA: Diagnosis not present

## 2022-02-03 DIAGNOSIS — F1120 Opioid dependence, uncomplicated: Secondary | ICD-10-CM | POA: Diagnosis not present

## 2022-02-16 ENCOUNTER — Other Ambulatory Visit (INDEPENDENT_AMBULATORY_CARE_PROVIDER_SITE_OTHER): Payer: Self-pay

## 2022-02-16 ENCOUNTER — Encounter (INDEPENDENT_AMBULATORY_CARE_PROVIDER_SITE_OTHER): Payer: Self-pay

## 2022-02-16 DIAGNOSIS — B1920 Unspecified viral hepatitis C without hepatic coma: Secondary | ICD-10-CM

## 2022-02-23 ENCOUNTER — Telehealth (INDEPENDENT_AMBULATORY_CARE_PROVIDER_SITE_OTHER): Payer: Self-pay | Admitting: *Deleted

## 2022-02-23 DIAGNOSIS — M459 Ankylosing spondylitis of unspecified sites in spine: Secondary | ICD-10-CM | POA: Diagnosis not present

## 2022-02-23 DIAGNOSIS — I700 Atherosclerosis of aorta: Secondary | ICD-10-CM | POA: Diagnosis not present

## 2022-02-23 DIAGNOSIS — G8929 Other chronic pain: Secondary | ICD-10-CM | POA: Diagnosis not present

## 2022-02-23 DIAGNOSIS — Z6834 Body mass index (BMI) 34.0-34.9, adult: Secondary | ICD-10-CM | POA: Diagnosis not present

## 2022-02-23 DIAGNOSIS — F1120 Opioid dependence, uncomplicated: Secondary | ICD-10-CM | POA: Diagnosis not present

## 2022-02-23 DIAGNOSIS — B1920 Unspecified viral hepatitis C without hepatic coma: Secondary | ICD-10-CM | POA: Diagnosis not present

## 2022-02-23 NOTE — Telephone Encounter (Signed)
Pt called and wanted to reschedule his 8/7 appt for after 8/17. He has labs on 8/17 and wanted appt for after lab work. Please call and reschedule patient.   [phone]

## 2022-02-23 NOTE — Telephone Encounter (Signed)
Francisco Burns. Patient first told me he was doing labs 8/11 but called back and he will finish meds on 8/18 which is a Friday and due for lab the week of 8/21. I let him know his appt was changed to 8/21 but now needs to be changed again to week of august 28th or later.

## 2022-02-25 NOTE — Telephone Encounter (Signed)
Patient aware appt changed to 9/11 at 8:45 and he would be getting a letter in the mail with appt info.

## 2022-03-01 DIAGNOSIS — H524 Presbyopia: Secondary | ICD-10-CM | POA: Diagnosis not present

## 2022-03-01 DIAGNOSIS — Z0100 Encounter for examination of eyes and vision without abnormal findings: Secondary | ICD-10-CM | POA: Diagnosis not present

## 2022-03-02 ENCOUNTER — Ambulatory Visit (INDEPENDENT_AMBULATORY_CARE_PROVIDER_SITE_OTHER): Payer: Medicare HMO | Admitting: Gastroenterology

## 2022-03-03 DIAGNOSIS — Z79899 Other long term (current) drug therapy: Secondary | ICD-10-CM | POA: Diagnosis not present

## 2022-03-03 DIAGNOSIS — M5030 Other cervical disc degeneration, unspecified cervical region: Secondary | ICD-10-CM | POA: Diagnosis not present

## 2022-03-03 DIAGNOSIS — Z5181 Encounter for therapeutic drug level monitoring: Secondary | ICD-10-CM | POA: Diagnosis not present

## 2022-03-03 DIAGNOSIS — M47812 Spondylosis without myelopathy or radiculopathy, cervical region: Secondary | ICD-10-CM | POA: Diagnosis not present

## 2022-03-08 ENCOUNTER — Ambulatory Visit (INDEPENDENT_AMBULATORY_CARE_PROVIDER_SITE_OTHER): Payer: Medicare HMO | Admitting: Gastroenterology

## 2022-03-09 ENCOUNTER — Other Ambulatory Visit (HOSPITAL_COMMUNITY): Payer: Self-pay | Admitting: Pain Medicine

## 2022-03-09 DIAGNOSIS — M47812 Spondylosis without myelopathy or radiculopathy, cervical region: Secondary | ICD-10-CM

## 2022-03-09 DIAGNOSIS — M5030 Other cervical disc degeneration, unspecified cervical region: Secondary | ICD-10-CM

## 2022-03-11 DIAGNOSIS — B1920 Unspecified viral hepatitis C without hepatic coma: Secondary | ICD-10-CM | POA: Diagnosis not present

## 2022-03-11 DIAGNOSIS — F1120 Opioid dependence, uncomplicated: Secondary | ICD-10-CM | POA: Diagnosis not present

## 2022-03-11 DIAGNOSIS — G8929 Other chronic pain: Secondary | ICD-10-CM | POA: Diagnosis not present

## 2022-03-11 DIAGNOSIS — M459 Ankylosing spondylitis of unspecified sites in spine: Secondary | ICD-10-CM | POA: Diagnosis not present

## 2022-03-15 ENCOUNTER — Telehealth (INDEPENDENT_AMBULATORY_CARE_PROVIDER_SITE_OTHER): Payer: Self-pay | Admitting: *Deleted

## 2022-03-15 NOTE — Telephone Encounter (Signed)
Pt left vm asking for a call back about his Hep C med.   [phone]

## 2022-03-15 NOTE — Telephone Encounter (Signed)
Left message to return call 

## 2022-03-16 NOTE — Telephone Encounter (Signed)
Left message to return call 

## 2022-03-17 NOTE — Telephone Encounter (Signed)
FYI - Patient states he will take his last ecpulsa pill tomorrow and he will do labs

## 2022-03-22 ENCOUNTER — Ambulatory Visit (INDEPENDENT_AMBULATORY_CARE_PROVIDER_SITE_OTHER): Payer: Medicare HMO | Admitting: Gastroenterology

## 2022-03-22 DIAGNOSIS — B1920 Unspecified viral hepatitis C without hepatic coma: Secondary | ICD-10-CM | POA: Diagnosis not present

## 2022-03-24 ENCOUNTER — Telehealth: Payer: Self-pay | Admitting: *Deleted

## 2022-03-24 DIAGNOSIS — G8929 Other chronic pain: Secondary | ICD-10-CM | POA: Diagnosis not present

## 2022-03-24 DIAGNOSIS — F1120 Opioid dependence, uncomplicated: Secondary | ICD-10-CM | POA: Diagnosis not present

## 2022-03-24 DIAGNOSIS — M459 Ankylosing spondylitis of unspecified sites in spine: Secondary | ICD-10-CM | POA: Diagnosis not present

## 2022-03-24 DIAGNOSIS — I700 Atherosclerosis of aorta: Secondary | ICD-10-CM | POA: Diagnosis not present

## 2022-03-24 NOTE — Telephone Encounter (Signed)
Pt called to get results of labs. I let him know one test HCV was still pending. He wanted to know when it would be back. I let him know probably by Friday.

## 2022-03-25 ENCOUNTER — Ambulatory Visit (HOSPITAL_COMMUNITY)
Admission: RE | Admit: 2022-03-25 | Discharge: 2022-03-25 | Disposition: A | Discharge status: Home / Self Care | Payer: Medicare HMO | Source: Ambulatory Visit | Attending: Pain Medicine | Admitting: Pain Medicine

## 2022-03-25 DIAGNOSIS — M47812 Spondylosis without myelopathy or radiculopathy, cervical region: Secondary | ICD-10-CM | POA: Diagnosis not present

## 2022-03-25 DIAGNOSIS — M5030 Other cervical disc degeneration, unspecified cervical region: Secondary | ICD-10-CM | POA: Diagnosis not present

## 2022-03-26 LAB — COMPREHENSIVE METABOLIC PANEL
AG Ratio: 1.8 (calc) (ref 1.0–2.5)
ALT: 16 U/L (ref 9–46)
AST: 14 U/L (ref 10–35)
Albumin: 4.4 g/dL (ref 3.6–5.1)
Alkaline phosphatase (APISO): 70 U/L (ref 35–144)
BUN: 12 mg/dL (ref 7–25)
CO2: 27 mmol/L (ref 20–32)
Calcium: 10 mg/dL (ref 8.6–10.3)
Chloride: 107 mmol/L (ref 98–110)
Creat: 0.81 mg/dL (ref 0.70–1.28)
Globulin: 2.5 g/dL (calc) (ref 1.9–3.7)
Glucose, Bld: 146 mg/dL — ABNORMAL HIGH (ref 65–99)
Potassium: 5.2 mmol/L (ref 3.5–5.3)
Sodium: 144 mmol/L (ref 135–146)
Total Bilirubin: 0.5 mg/dL (ref 0.2–1.2)
Total Protein: 6.9 g/dL (ref 6.1–8.1)

## 2022-03-26 LAB — HCV RNA, QUANT REAL-TIME PCR W/REFLEX
HCV RNA, PCR, QN (Log): <1.18 LogIU/mL
HCV RNA, PCR, QN: <15 IU/mL

## 2022-03-26 NOTE — Progress Notes (Signed)
Patient notified he needs to keep his appt.

## 2022-03-26 NOTE — Progress Notes (Signed)
Yes, he needs to keep his appointment

## 2022-04-09 DIAGNOSIS — E039 Hypothyroidism, unspecified: Secondary | ICD-10-CM | POA: Diagnosis not present

## 2022-04-09 DIAGNOSIS — E049 Nontoxic goiter, unspecified: Secondary | ICD-10-CM | POA: Diagnosis not present

## 2022-04-12 ENCOUNTER — Encounter (INDEPENDENT_AMBULATORY_CARE_PROVIDER_SITE_OTHER): Payer: Self-pay | Admitting: Gastroenterology

## 2022-04-12 ENCOUNTER — Ambulatory Visit (INDEPENDENT_AMBULATORY_CARE_PROVIDER_SITE_OTHER): Payer: Medicare HMO | Admitting: Gastroenterology

## 2022-04-12 VITALS — BP 160/88 | HR 65 | Temp 98.1°F | Ht 74.0 in | Wt 260.3 lb

## 2022-04-12 DIAGNOSIS — B182 Chronic viral hepatitis C: Secondary | ICD-10-CM | POA: Diagnosis not present

## 2022-04-12 NOTE — Progress Notes (Signed)
Maylon Peppers, M.D. Gastroenterology & Hepatology [facility] [address] [place], [place] [address]  Primary Care Physician: Redmond School, MD [address] [place] [address]  I will communicate my assessment and recommendations to the referring MD via EMR.  Problems: Chronic Hepatitis C genotype 1a  History of Present Illness: Francisco Burns is a 71 y.o. male  with past medical history of Hepatitis C, Skin Cancer, atherosclerosis, Hypothyroidism, chronic pain syndrome, who comes for follow-up of hepatitis C.  The patient was last seen on 11/30/2021. At that time, the patient the patient had multiple testing prior to initiation of treating her hepatitis C.  Liver elastography was normal.  Patient was found to have active chronic hepatitis C genotype Ia for which he was prescribed Epclusa for 12 weeks.  Patient had repeat HCVRNA after finishing his treatment with Epclusa on 03/22/2022 which was negative. States he took the medication compliantly. He denies taking any PPIs while taking the medication. He will be in [place] around mid November so may have repeat blood work-up at [place].  The patient denies having any nausea, vomiting, fever, chills, hematochezia, melena, hematemesis, abdominal distention, abdominal pain, diarrhea, jaundice, pruritus or weight loss.  Last EGD: never Last Colonoscopy: never  Past Medical History: Past Medical History:  Diagnosis Date   Hepatitis C    Skin cancer    Thyroid condition     Past Surgical History: Past Surgical History:  Procedure Laterality Date   BACK SURGERY     TONSILLECTOMY AND ADENOIDECTOMY      Family History:History reviewed. No pertinent family history.  Social History: Social History   Tobacco Use  Smoking Status Former   Types: E-cigarettes   Passive exposure: Current  Smokeless Tobacco Never   Social History   Substance and Sexual Activity   Alcohol Use Not Currently   Social History   Substance and Sexual Activity  Drug Use Never    Allergies: No Known Allergies  Medications: Current Outpatient Medications  Medication Sig Dispense Refill   levothyroxine (SYNTHROID) 25 MCG tablet Take 25 mcg by mouth daily before breakfast.     Multiple Vitamin (MULTIVITAMIN) tablet Take 1 tablet by mouth daily.     No current facility-administered medications for this visit.    Review of Systems: GENERAL: negative for malaise, night sweats HEENT: No changes in hearing or vision, no nose bleeds or other nasal problems. NECK: Negative for lumps, goiter, pain and significant neck swelling RESPIRATORY: Negative for cough, wheezing CARDIOVASCULAR: Negative for chest pain, leg swelling, palpitations, orthopnea GI: SEE HPI MUSCULOSKELETAL: Negative for joint pain or swelling, back pain, and muscle pain. SKIN: Negative for lesions, rash PSYCH: Negative for sleep disturbance, mood disorder and recent psychosocial stressors. HEMATOLOGY Negative for prolonged bleeding, bruising easily, and swollen nodes. ENDOCRINE: Negative for cold or heat intolerance, polyuria, polydipsia and goiter. NEURO: negative for tremor, gait imbalance, syncope and seizures. The remainder of the review of systems is noncontributory.   Physical Exam: BP (!) 160/88 (BP Location: Left Arm, Patient Position: Sitting, Cuff Size: Large)   Pulse 65   Temp 98.1 F (36.7 C) (Oral)   Ht '6\' 2"'$  (1.88 m)   Wt 260 lb 4.8 oz (118.1 kg)   BMI 33.42 kg/m  GENERAL: The patient is AO x3, in no acute distress. HEENT: Head is normocephalic and atraumatic. EOMI are intact. Mouth is well hydrated and without lesions. NECK: Supple. No masses LUNGS: Clear to auscultation. No presence of  rhonchi/wheezing/rales. Adequate chest expansion HEART: RRR, normal s1 and s2. ABDOMEN: Soft, nontender, no guarding, no peritoneal signs, and nondistended. BS +. No masses. EXTREMITIES:  Without any cyanosis, clubbing, rash, lesions or edema. NEUROLOGIC: AOx3, no focal motor deficit. SKIN: no jaundice, no rashes  Imaging/Labs: as above  I personally reviewed and interpreted the available labs, imaging and endoscopic files.  Impression and Plan: Francisco Burns is a 71 y.o. male  with past medical history of Hepatitis C, Skin Cancer, atherosclerosis, Hypothyroidism, chronic pain syndrome, who comes for follow-up of hepatitis C.  Patient has a chronic history of hepatitis C that was treated with Epclusa.  He achieved viral suppression after finishing his treatment course.  I explained to the patient that we will need to verify if he indeed reach SVR by checking his repeat viral load and CMP 3 months after finishing his Epclusa, which he will proceed with.  Finally, I discussed with him again the importance of colorectal cancer screening.  The patient was more interested this time about the procedure but he said that he will call us back when he is ready to proceed with colonoscopy.  -Check HCVRNA and CMP in mid November -Patient to call us back if interested in proceeding with colonoscopy  All questions were answered.      Harvel Quale, MD Gastroenterology and Hepatology [facility]

## 2022-04-12 NOTE — Patient Instructions (Signed)
Perform blood workup in mid November Please call us back if interested in proceeding with colonoscopy

## 2022-04-20 ENCOUNTER — Other Ambulatory Visit (HOSPITAL_COMMUNITY): Payer: Self-pay | Admitting: Endocrinology

## 2022-04-20 ENCOUNTER — Other Ambulatory Visit: Payer: Self-pay | Admitting: Endocrinology

## 2022-04-20 DIAGNOSIS — E039 Hypothyroidism, unspecified: Secondary | ICD-10-CM

## 2022-04-21 DIAGNOSIS — M5030 Other cervical disc degeneration, unspecified cervical region: Secondary | ICD-10-CM | POA: Diagnosis not present

## 2022-04-21 DIAGNOSIS — M47812 Spondylosis without myelopathy or radiculopathy, cervical region: Secondary | ICD-10-CM | POA: Diagnosis not present

## 2022-05-10 ENCOUNTER — Encounter (HOSPITAL_COMMUNITY): Payer: Self-pay

## 2022-05-10 ENCOUNTER — Ambulatory Visit (HOSPITAL_COMMUNITY): Payer: Medicare HMO

## 2022-05-18 DIAGNOSIS — M459 Ankylosing spondylitis of unspecified sites in spine: Secondary | ICD-10-CM | POA: Diagnosis not present

## 2022-05-18 DIAGNOSIS — F1120 Opioid dependence, uncomplicated: Secondary | ICD-10-CM | POA: Diagnosis not present

## 2022-05-18 DIAGNOSIS — G8929 Other chronic pain: Secondary | ICD-10-CM | POA: Diagnosis not present

## 2022-05-26 DIAGNOSIS — M47812 Spondylosis without myelopathy or radiculopathy, cervical region: Secondary | ICD-10-CM | POA: Diagnosis not present

## 2022-05-26 DIAGNOSIS — M5030 Other cervical disc degeneration, unspecified cervical region: Secondary | ICD-10-CM | POA: Diagnosis not present

## 2022-06-02 DIAGNOSIS — J209 Acute bronchitis, unspecified: Secondary | ICD-10-CM | POA: Diagnosis not present

## 2022-06-02 DIAGNOSIS — Z6835 Body mass index (BMI) 35.0-35.9, adult: Secondary | ICD-10-CM | POA: Diagnosis not present

## 2022-06-02 DIAGNOSIS — E6609 Other obesity due to excess calories: Secondary | ICD-10-CM | POA: Diagnosis not present

## 2022-06-02 DIAGNOSIS — F1120 Opioid dependence, uncomplicated: Secondary | ICD-10-CM | POA: Diagnosis not present

## 2022-06-02 DIAGNOSIS — G8929 Other chronic pain: Secondary | ICD-10-CM | POA: Diagnosis not present

## 2022-06-16 DIAGNOSIS — B182 Chronic viral hepatitis C: Secondary | ICD-10-CM | POA: Diagnosis not present

## 2022-06-17 DIAGNOSIS — F1120 Opioid dependence, uncomplicated: Secondary | ICD-10-CM | POA: Diagnosis not present

## 2022-06-17 DIAGNOSIS — G8929 Other chronic pain: Secondary | ICD-10-CM | POA: Diagnosis not present

## 2022-06-17 DIAGNOSIS — M459 Ankylosing spondylitis of unspecified sites in spine: Secondary | ICD-10-CM | POA: Diagnosis not present

## 2022-06-18 DIAGNOSIS — Z6834 Body mass index (BMI) 34.0-34.9, adult: Secondary | ICD-10-CM | POA: Diagnosis not present

## 2022-06-18 DIAGNOSIS — M179 Osteoarthritis of knee, unspecified: Secondary | ICD-10-CM | POA: Diagnosis not present

## 2022-06-18 DIAGNOSIS — E6609 Other obesity due to excess calories: Secondary | ICD-10-CM | POA: Diagnosis not present

## 2022-06-18 DIAGNOSIS — M459 Ankylosing spondylitis of unspecified sites in spine: Secondary | ICD-10-CM | POA: Diagnosis not present

## 2022-06-18 DIAGNOSIS — F1120 Opioid dependence, uncomplicated: Secondary | ICD-10-CM | POA: Diagnosis not present

## 2022-06-18 DIAGNOSIS — G8929 Other chronic pain: Secondary | ICD-10-CM | POA: Diagnosis not present

## 2022-06-20 LAB — COMPREHENSIVE METABOLIC PANEL
AG Ratio: 1.8 (calc) (ref 1.0–2.5)
ALT: 13 U/L (ref 9–46)
AST: 11 U/L (ref 10–35)
Albumin: 4.3 g/dL (ref 3.6–5.1)
Alkaline phosphatase (APISO): 70 U/L (ref 35–144)
BUN: 19 mg/dL (ref 7–25)
CO2: 30 mmol/L (ref 20–32)
Calcium: 9.6 mg/dL (ref 8.6–10.3)
Chloride: 102 mmol/L (ref 98–110)
Creat: 0.94 mg/dL (ref 0.70–1.28)
Globulin: 2.4 g/dL (calc) (ref 1.9–3.7)
Glucose, Bld: 186 mg/dL — ABNORMAL HIGH (ref 65–99)
Potassium: 4.7 mmol/L (ref 3.5–5.3)
Sodium: 138 mmol/L (ref 135–146)
Total Bilirubin: 0.5 mg/dL (ref 0.2–1.2)
Total Protein: 6.7 g/dL (ref 6.1–8.1)

## 2022-06-20 LAB — HCV RNA, QUANT REAL-TIME PCR W/REFLEX
HCV RNA, PCR, QN (Log): <1.18 LogIU/mL
HCV RNA, PCR, QN: <15 IU/mL

## 2022-07-13 DIAGNOSIS — M459 Ankylosing spondylitis of unspecified sites in spine: Secondary | ICD-10-CM | POA: Diagnosis not present

## 2022-07-13 DIAGNOSIS — I700 Atherosclerosis of aorta: Secondary | ICD-10-CM | POA: Diagnosis not present

## 2022-07-13 DIAGNOSIS — F1120 Opioid dependence, uncomplicated: Secondary | ICD-10-CM | POA: Diagnosis not present

## 2022-07-13 DIAGNOSIS — G8929 Other chronic pain: Secondary | ICD-10-CM | POA: Diagnosis not present

## 2022-08-09 DIAGNOSIS — R059 Cough, unspecified: Secondary | ICD-10-CM | POA: Diagnosis not present

## 2022-08-09 DIAGNOSIS — J209 Acute bronchitis, unspecified: Secondary | ICD-10-CM | POA: Diagnosis not present

## 2022-09-09 DIAGNOSIS — F1120 Opioid dependence, uncomplicated: Secondary | ICD-10-CM | POA: Diagnosis not present

## 2022-09-09 DIAGNOSIS — G8929 Other chronic pain: Secondary | ICD-10-CM | POA: Diagnosis not present

## 2022-09-09 DIAGNOSIS — I10 Essential (primary) hypertension: Secondary | ICD-10-CM | POA: Diagnosis not present

## 2022-09-09 DIAGNOSIS — M179 Osteoarthritis of knee, unspecified: Secondary | ICD-10-CM | POA: Diagnosis not present

## 2022-09-16 DIAGNOSIS — I10 Essential (primary) hypertension: Secondary | ICD-10-CM | POA: Diagnosis not present

## 2022-10-08 DIAGNOSIS — I10 Essential (primary) hypertension: Secondary | ICD-10-CM | POA: Diagnosis not present

## 2022-10-08 DIAGNOSIS — M459 Ankylosing spondylitis of unspecified sites in spine: Secondary | ICD-10-CM | POA: Diagnosis not present

## 2022-10-08 DIAGNOSIS — J209 Acute bronchitis, unspecified: Secondary | ICD-10-CM | POA: Diagnosis not present

## 2022-10-08 DIAGNOSIS — G8929 Other chronic pain: Secondary | ICD-10-CM | POA: Diagnosis not present

## 2022-10-08 DIAGNOSIS — T50905A Adverse effect of unspecified drugs, medicaments and biological substances, initial encounter: Secondary | ICD-10-CM | POA: Diagnosis not present

## 2022-10-08 DIAGNOSIS — I700 Atherosclerosis of aorta: Secondary | ICD-10-CM | POA: Diagnosis not present

## 2022-10-08 DIAGNOSIS — M179 Osteoarthritis of knee, unspecified: Secondary | ICD-10-CM | POA: Diagnosis not present

## 2022-10-08 DIAGNOSIS — Z6833 Body mass index (BMI) 33.0-33.9, adult: Secondary | ICD-10-CM | POA: Diagnosis not present

## 2022-10-08 DIAGNOSIS — F1120 Opioid dependence, uncomplicated: Secondary | ICD-10-CM | POA: Diagnosis not present

## 2022-10-19 DIAGNOSIS — I10 Essential (primary) hypertension: Secondary | ICD-10-CM | POA: Diagnosis not present

## 2022-10-23 DIAGNOSIS — Z6831 Body mass index (BMI) 31.0-31.9, adult: Secondary | ICD-10-CM | POA: Diagnosis not present

## 2022-10-23 DIAGNOSIS — J40 Bronchitis, not specified as acute or chronic: Secondary | ICD-10-CM | POA: Diagnosis not present

## 2022-10-23 DIAGNOSIS — E669 Obesity, unspecified: Secondary | ICD-10-CM | POA: Diagnosis not present

## 2022-11-02 DIAGNOSIS — M459 Ankylosing spondylitis of unspecified sites in spine: Secondary | ICD-10-CM | POA: Diagnosis not present

## 2022-11-02 DIAGNOSIS — F1120 Opioid dependence, uncomplicated: Secondary | ICD-10-CM | POA: Diagnosis not present

## 2022-11-02 DIAGNOSIS — J209 Acute bronchitis, unspecified: Secondary | ICD-10-CM | POA: Diagnosis not present

## 2022-11-02 DIAGNOSIS — I10 Essential (primary) hypertension: Secondary | ICD-10-CM | POA: Diagnosis not present

## 2022-11-26 ENCOUNTER — Other Ambulatory Visit (HOSPITAL_COMMUNITY): Payer: Self-pay | Admitting: Internal Medicine

## 2022-11-26 DIAGNOSIS — R0600 Dyspnea, unspecified: Secondary | ICD-10-CM

## 2022-11-26 DIAGNOSIS — G8929 Other chronic pain: Secondary | ICD-10-CM | POA: Diagnosis not present

## 2022-11-26 DIAGNOSIS — R600 Localized edema: Secondary | ICD-10-CM | POA: Diagnosis not present

## 2022-11-26 DIAGNOSIS — I10 Essential (primary) hypertension: Secondary | ICD-10-CM | POA: Diagnosis not present

## 2022-12-14 ENCOUNTER — Encounter: Payer: Self-pay | Admitting: Internal Medicine

## 2022-12-14 ENCOUNTER — Ambulatory Visit: Payer: Medicare HMO | Attending: Internal Medicine | Admitting: Internal Medicine

## 2022-12-14 VITALS — BP 132/70 | HR 67 | Ht 74.0 in | Wt 232.8 lb

## 2022-12-14 DIAGNOSIS — I10 Essential (primary) hypertension: Secondary | ICD-10-CM

## 2022-12-14 DIAGNOSIS — R0609 Other forms of dyspnea: Secondary | ICD-10-CM | POA: Diagnosis not present

## 2022-12-14 DIAGNOSIS — I509 Heart failure, unspecified: Secondary | ICD-10-CM

## 2022-12-14 MED ORDER — FUROSEMIDE 40 MG PO TABS: 60.0000 mg | ORAL_TABLET | Freq: Every day | ORAL | 3 refills | Status: DC

## 2022-12-14 MED ORDER — POTASSIUM CHLORIDE CRYS ER 20 MEQ PO TBCR: 20.0000 meq | EXTENDED_RELEASE_TABLET | Freq: Every day | ORAL | 3 refills | Status: DC

## 2022-12-14 NOTE — Patient Instructions (Signed)
Medication Instructions:  Your physician has recommended you make the following change in your medication:   - Stop Hydrochlorothiazide  -Start Lasix 60 mg tablets twice daily  -Start Potassium Chloride 20 mEq once daily   *If you need a refill on your cardiac medications before your next appointment, please call your pharmacy*   Lab Work: In 5 Days:  BNP BMP  If you have labs (blood work) drawn today and your tests are completely normal, you will receive your results only by: MyChart Message (if you have MyChart) OR A paper copy in the mail If you have any lab test that is abnormal or we need to change your treatment, we will call you to review the results.   Testing/Procedures: Your physician has requested that you have an echocardiogram. Echocardiography is a painless test that uses sound waves to create images of your heart. It provides your doctor with information about the size and shape of your heart and how well your heart's chambers and valves are working. This procedure takes approximately one hour. There are no restrictions for this procedure. Please do NOT wear cologne, perfume, aftershave, or lotions (deodorant is allowed). Please arrive 15 minutes prior to your appointment time.    Follow-Up: At [facility], you and your health needs are our priority.  As part of our continuing mission to provide you with exceptional heart care, we have created designated Provider Care Teams.  These Care Teams include your primary Cardiologist (physician) and Advanced Practice Providers (APPs -  Physician Assistants and Nurse Practitioners) who all work together to provide you with the care you need, when you need it.  We recommend signing up for the patient portal called "MyChart".  Sign up information is provided on this After Visit Summary.  MyChart is used to connect with patients for Virtual Visits (Telemedicine).  Patients are able to view lab/test results, encounter  notes, upcoming appointments, etc.  Non-urgent messages can be sent to your provider as well.   To learn more about what you can do with MyChart, go to [email or web address].    Your next appointment:   1 month(s)  Provider:   Luane School, MD    Other Instructions

## 2022-12-15 ENCOUNTER — Telehealth: Payer: Self-pay | Admitting: Internal Medicine

## 2022-12-15 DIAGNOSIS — M179 Osteoarthritis of knee, unspecified: Secondary | ICD-10-CM | POA: Diagnosis not present

## 2022-12-15 DIAGNOSIS — R600 Localized edema: Secondary | ICD-10-CM | POA: Diagnosis not present

## 2022-12-15 DIAGNOSIS — Z6831 Body mass index (BMI) 31.0-31.9, adult: Secondary | ICD-10-CM | POA: Diagnosis not present

## 2022-12-15 DIAGNOSIS — I10 Essential (primary) hypertension: Secondary | ICD-10-CM | POA: Diagnosis not present

## 2022-12-15 DIAGNOSIS — M459 Ankylosing spondylitis of unspecified sites in spine: Secondary | ICD-10-CM | POA: Diagnosis not present

## 2022-12-15 DIAGNOSIS — E6609 Other obesity due to excess calories: Secondary | ICD-10-CM | POA: Diagnosis not present

## 2022-12-15 DIAGNOSIS — G8929 Other chronic pain: Secondary | ICD-10-CM | POA: Diagnosis not present

## 2022-12-15 DIAGNOSIS — F1120 Opioid dependence, uncomplicated: Secondary | ICD-10-CM | POA: Diagnosis not present

## 2022-12-15 DIAGNOSIS — I700 Atherosclerosis of aorta: Secondary | ICD-10-CM | POA: Diagnosis not present

## 2022-12-15 MED ORDER — FUROSEMIDE 40 MG PO TABS: 60.0000 mg | ORAL_TABLET | Freq: Two times a day (BID) | ORAL | 3 refills | Status: DC

## 2022-12-15 NOTE — Telephone Encounter (Signed)
Pt c/o medication issue:  1. Name of Medication: furosemide (LASIX) 40 MG tablet   2. How are you currently taking this medication (dosage and times per day)?   3. Are you having a reaction (difficulty breathing--STAT)?   4. What is your medication issue? Patient is requesting call back to get clarification as to how to take this medication. Please advise

## 2022-12-15 NOTE — Telephone Encounter (Signed)
Take 1.5 tablets (60 mg total) by mouth 2 (two) times daily.   Patient notified and verbalized understanding.

## 2022-12-19 DIAGNOSIS — I509 Heart failure, unspecified: Secondary | ICD-10-CM | POA: Insufficient documentation

## 2022-12-19 DIAGNOSIS — I10 Essential (primary) hypertension: Secondary | ICD-10-CM | POA: Insufficient documentation

## 2022-12-19 NOTE — Progress Notes (Signed)
Cardiology Office Note  Date: 12/19/2022   ID: ESTES [address]RE[address], DOB 07/30/1951, MRN [id number]  PCP:  Elfredia Nevins, MD  Cardiologist:  Marjo Bicker, MD Electrophysiologist:  None   Reason for Office Visit: Evaluation of DOE at the request of Dr. Carlena Sax   History of Present Illness: Francisco Burns is a 72 y.o. male with HTN was referred to cardiology clinic for DOE and leg swelling evaluation.  Patient reported DOE, orthopnea, PND and bilateral lower EXTR swelling x 1 month.  His PCP prescribed him p.o. Lasix 20 mg to be taken as needed.  Patient has been taking 2 to 3 pills of Lasix once daily with adequate relief in his symptoms of DOE and bilateral lower EXTR swelling.  Although he still continues to have symptoms.  Denied any angina.  Denied palpitations or syncope.  No prior ischemia evaluation was performed.  Past Medical History:  Diagnosis Date   Hepatitis C    Skin cancer    Thyroid condition     Past Surgical History:  Procedure Laterality Date   BACK SURGERY     TONSILLECTOMY AND ADENOIDECTOMY      Current Outpatient Medications  Medication Sig Dispense Refill   albuterol (PROVENTIL) 2 MG tablet Take 2 mg by mouth 3 (three) times daily.     amLODipine (NORVASC) 2.5 MG tablet Take 2.5 mg by mouth daily.     levothyroxine (SYNTHROID) 25 MCG tablet Take 25 mcg by mouth daily before breakfast.     Multiple Vitamin (MULTIVITAMIN) tablet Take 1 tablet by mouth daily.     oxyCODONE (ROXICODONE) 15 MG immediate release tablet Take 15 mg by mouth every 4 (four) hours.     potassium chloride SA (KLOR-CON M) 20 MEQ tablet Take 1 tablet (20 mEq total) by mouth daily. 90 tablet 3   VENTOLIN HFA 108 (90 Base) MCG/ACT inhaler SMARTSIG:2 Puff(s) By Mouth Every 4 Hours     furosemide (LASIX) 40 MG tablet Take 1.5 tablets (60 mg total) by mouth 2 (two) times daily. 135 tablet 3   No current facility-administered medications for this visit.   Allergies:  Patient has no  known allergies.   Social History: The patient  reports that he has quit smoking. His smoking use included e-cigarettes. He has been exposed to tobacco smoke. He has never used smokeless tobacco. He reports that he does not currently use alcohol. He reports that he does not use drugs.   Family History: The patient's family history is not on file.   ROS:  Please see the history of present illness. Otherwise, complete review of systems is positive for none.  All other systems are reviewed and negative.   Physical Exam: VS:  BP 132/70   Pulse 67   Ht 6\' 2"  (1.88 m)   Wt 232 lb 12.8 oz (105.6 kg)   SpO2 95%   BMI 29.89 kg/m , BMI Body mass index is 29.89 kg/m.  Wt Readings from Last 3 Encounters:  12/14/22 232 lb 12.8 oz (105.6 kg)  04/12/22 260 lb 4.8 oz (118.1 kg)  11/30/21 254 lb (115.2 kg)    General: Patient appears comfortable at rest. HEENT: Conjunctiva and lids normal, oropharynx clear with moist mucosa. Neck: Supple, no elevated JVP or carotid bruits, no thyromegaly. Lungs: Clear to auscultation, nonlabored breathing at rest. Cardiac: Regular rate and rhythm, no S3 or significant systolic murmur, no pericardial rub. Abdomen: Soft, nontender, no hepatomegaly, bowel sounds present, no guarding or rebound. Extremities:  2+ pitting edema Skin: Warm and dry. Musculoskeletal: No kyphosis. Neuropsychiatric: Alert and oriented x3, affect grossly appropriate.  Recent Labwork: 06/16/2022: ALT 13; AST 11; BUN 19; Creat 0.94; Potassium 4.7; Sodium 138  No results found for: "CHOL", "TRIG", "HDL", "CHOLHDL", "VLDL", "LDLCALC", "LDLDIRECT"   Assessment and Plan: Patient is a 72 year old M with HTN was referred to cardiology clinic for evaluation of DOE.  # Congestive heart failure -Patient has symptoms of DOE, orthopnea, PND and bilateral lower EXTR swelling x 1 month.  He was taking p.o. Lasix 40-60 mg once daily with partial relief in his symptoms of SOB and swelling. Will increase  the p.o. Lasix dose to 60 mg twice daily and start KCl 20 mEq once daily.  Obtain 2D echocardiogram. Obtain BNP today and BMP in 5 days. -Discontinue HCTZ  # HTN, controlled -Discontinue HCTZ and start Lasix as stated above -Continue amlodipine 2.5 mg once daily   I have spent a total of 45 minutes with patient reviewing chart, EKGs, labs and examining patient as well as establishing an assessment and plan that was discussed with the patient.  > 50% of time was spent in direct patient care.    Medication Adjustments/Labs and Tests Ordered: Current medicines are reviewed at length with the patient today.  Concerns regarding medicines are outlined above.   Tests Ordered: Orders Placed This Encounter  Procedures   B Nat Peptide   Basic metabolic panel   EKG 12-Lead   ECHOCARDIOGRAM COMPLETE    Medication Changes: Meds ordered this encounter  Medications   DISCONTD: furosemide (LASIX) 40 MG tablet    Sig: Take 1.5 tablets (60 mg total) by mouth daily.    Dispense:  135 tablet    Refill:  3   potassium chloride SA (KLOR-CON M) 20 MEQ tablet    Sig: Take 1 tablet (20 mEq total) by mouth daily.    Dispense:  90 tablet    Refill:  3    Disposition:  Follow up  1 month after Echo  Signed, Zhoe Catania Verne Spurr, MD, 12/19/2022 7:12 AM    [facility] [address] [place], [place] [address]

## 2023-01-03 DIAGNOSIS — R600 Localized edema: Secondary | ICD-10-CM | POA: Diagnosis not present

## 2023-01-03 DIAGNOSIS — M459 Ankylosing spondylitis of unspecified sites in spine: Secondary | ICD-10-CM | POA: Diagnosis not present

## 2023-01-03 DIAGNOSIS — M179 Osteoarthritis of knee, unspecified: Secondary | ICD-10-CM | POA: Diagnosis not present

## 2023-01-03 DIAGNOSIS — I10 Essential (primary) hypertension: Secondary | ICD-10-CM | POA: Diagnosis not present

## 2023-01-13 DIAGNOSIS — I10 Essential (primary) hypertension: Secondary | ICD-10-CM | POA: Diagnosis not present

## 2023-01-13 DIAGNOSIS — R600 Localized edema: Secondary | ICD-10-CM | POA: Diagnosis not present

## 2023-01-13 DIAGNOSIS — B1920 Unspecified viral hepatitis C without hepatic coma: Secondary | ICD-10-CM | POA: Diagnosis not present

## 2023-01-13 DIAGNOSIS — R0600 Dyspnea, unspecified: Secondary | ICD-10-CM | POA: Diagnosis not present

## 2023-01-14 DIAGNOSIS — L720 Epidermal cyst: Secondary | ICD-10-CM | POA: Diagnosis not present

## 2023-01-14 DIAGNOSIS — D2272 Melanocytic nevi of left lower limb, including hip: Secondary | ICD-10-CM | POA: Diagnosis not present

## 2023-01-14 DIAGNOSIS — L570 Actinic keratosis: Secondary | ICD-10-CM | POA: Diagnosis not present

## 2023-01-14 DIAGNOSIS — L821 Other seborrheic keratosis: Secondary | ICD-10-CM | POA: Diagnosis not present

## 2023-01-14 DIAGNOSIS — L853 Xerosis cutis: Secondary | ICD-10-CM | POA: Diagnosis not present

## 2023-01-14 DIAGNOSIS — D225 Melanocytic nevi of trunk: Secondary | ICD-10-CM | POA: Diagnosis not present

## 2023-01-24 DIAGNOSIS — M179 Osteoarthritis of knee, unspecified: Secondary | ICD-10-CM | POA: Diagnosis not present

## 2023-01-24 DIAGNOSIS — I10 Essential (primary) hypertension: Secondary | ICD-10-CM | POA: Diagnosis not present

## 2023-01-24 DIAGNOSIS — F1120 Opioid dependence, uncomplicated: Secondary | ICD-10-CM | POA: Diagnosis not present

## 2023-01-24 DIAGNOSIS — R0600 Dyspnea, unspecified: Secondary | ICD-10-CM | POA: Diagnosis not present

## 2023-01-27 ENCOUNTER — Ambulatory Visit (HOSPITAL_COMMUNITY)
Admission: RE | Admit: 2023-01-27 | Discharge: 2023-01-27 | Disposition: A | Discharge status: Home / Self Care | Payer: Medicare HMO | Source: Ambulatory Visit | Attending: Internal Medicine | Admitting: Internal Medicine

## 2023-01-27 DIAGNOSIS — R0609 Other forms of dyspnea: Secondary | ICD-10-CM | POA: Diagnosis not present

## 2023-01-27 LAB — ECHOCARDIOGRAM COMPLETE
Area-P 1/2: 2.32 cm2
S' Lateral: 3.6 cm

## 2023-01-27 NOTE — Progress Notes (Signed)
*  PRELIMINARY RESULTS* Echocardiogram 2D Echocardiogram has been performed.  Stacey Drain 01/27/2023, 11:13 AM

## 2023-01-31 ENCOUNTER — Ambulatory Visit: Payer: Medicare HMO | Attending: Internal Medicine | Admitting: Internal Medicine

## 2023-01-31 ENCOUNTER — Encounter: Payer: Self-pay | Admitting: Internal Medicine

## 2023-01-31 VITALS — BP 118/70 | HR 84 | Ht 74.0 in | Wt 219.4 lb

## 2023-01-31 DIAGNOSIS — I5033 Acute on chronic diastolic (congestive) heart failure: Secondary | ICD-10-CM

## 2023-01-31 DIAGNOSIS — R0609 Other forms of dyspnea: Secondary | ICD-10-CM

## 2023-01-31 DIAGNOSIS — I5030 Unspecified diastolic (congestive) heart failure: Secondary | ICD-10-CM | POA: Insufficient documentation

## 2023-01-31 DIAGNOSIS — I5020 Unspecified systolic (congestive) heart failure: Secondary | ICD-10-CM

## 2023-01-31 DIAGNOSIS — I509 Heart failure, unspecified: Secondary | ICD-10-CM

## 2023-01-31 MED ORDER — METOPROLOL TARTRATE 100 MG PO TABS: ORAL_TABLET | ORAL | 0 refills | Status: DC

## 2023-01-31 MED ORDER — FUROSEMIDE 20 MG PO TABS: 20.0000 mg | ORAL_TABLET | Freq: Every day | ORAL | 3 refills | Status: DC

## 2023-01-31 MED ORDER — DAPAGLIFLOZIN PROPANEDIOL 10 MG PO TABS: 10.0000 mg | ORAL_TABLET | Freq: Every day | ORAL | 6 refills | Status: DC

## 2023-01-31 MED ORDER — DAPAGLIFLOZIN PROPANEDIOL 10 MG PO TABS: 10.0000 mg | ORAL_TABLET | Freq: Every day | ORAL | 0 refills | Status: AC

## 2023-01-31 NOTE — Patient Instructions (Signed)
Medication Instructions:  DECREASE Lasix to 20 mg daily    START Farxiga 10 mg daily   Labwork: BMET in 5 days (02/05/23)  Testing/Procedures: Your physician has requested that you have cardiac CT. Cardiac computed tomography (CT) is a painless test that uses an x-ray machine to take clear, detailed pictures of your heart. For further information please visit [email or web address] Please follow instruction sheet as given.    Follow-Up: 6 months  Any Other Special Instructions Will Be Listed Below (If Applicable).  If you need a refill on your cardiac medications before your next appointment, please call your pharmacy.

## 2023-01-31 NOTE — Progress Notes (Signed)
Cardiology Office Note  Date: 01/31/2023   ID: Francisco Burns, DOB 06-30-1951, MRN [id number]  PCP:  Elfredia Nevins, MD  Cardiologist:  Marjo Bicker, MD Electrophysiologist:  None   Reason for Office Visit: Evaluation of DOE at the request of Dr. Carlena Sax   History of Present Illness: Francisco Burns is a 72 y.o. male with HTN is here for follow-up visit.  Patient was referred to cardiology clinic for evaluation of DOE, orthopnea, PND and bilateral lower extremity swelling x 1 month.  Echocardiogram in 6/24 showed normal LVEF, G1 DD and no significant valvular heart disease.  He is here for follow-up visit.  His symptoms completely resolved. No more DOE, orthopnea, PND or lower EXTR swelling.  He was previously on p.o. Lasix 80 mg twice daily which he self decreased to Lasix 20 mg once daily.  He is overall feeling great.  No angina.  No syncope, dizziness, palpitations.  No prior ischemia evaluation.  Past Medical History:  Diagnosis Date   Hepatitis C    Skin cancer    Thyroid condition     Past Surgical History:  Procedure Laterality Date   BACK SURGERY     TONSILLECTOMY AND ADENOIDECTOMY      Current Outpatient Medications  Medication Sig Dispense Refill   furosemide (LASIX) 40 MG tablet Take 1.5 tablets (60 mg total) by mouth 2 (two) times daily. 135 tablet 3   levothyroxine (SYNTHROID) 25 MCG tablet Take 25 mcg by mouth daily before breakfast.     oxycodone (ROXICODONE) 30 MG immediate release tablet Take 30 mg by mouth every 4 (four) hours.     potassium chloride SA (KLOR-CON M) 20 MEQ tablet Take 1 tablet (20 mEq total) by mouth daily. (Patient taking differently: Take 10 mEq by mouth daily.) 90 tablet 3   albuterol (PROVENTIL) 2 MG tablet Take 2 mg by mouth 3 (three) times daily. (Patient not taking: Reported on 01/31/2023)     amLODipine (NORVASC) 2.5 MG tablet Take 2.5 mg by mouth daily. (Patient not taking: Reported on 01/31/2023)     Multiple Vitamin  (MULTIVITAMIN) tablet Take 1 tablet by mouth daily. (Patient not taking: Reported on 01/31/2023)     VENTOLIN HFA 108 (90 Base) MCG/ACT inhaler SMARTSIG:2 Puff(s) By Mouth Every 4 Hours (Patient not taking: Reported on 01/31/2023)     No current facility-administered medications for this visit.   Allergies:  Patient has no known allergies.   Social History: The patient  reports that he has quit smoking. His smoking use included e-cigarettes. He has been exposed to tobacco smoke. He has never used smokeless tobacco. He reports that he does not currently use alcohol. He reports that he does not use drugs.   Family History: The patient's family history is not on file.   ROS:  Please see the history of present illness. Otherwise, complete review of systems is positive for none.  All other systems are reviewed and negative.   Physical Exam: VS:  BP 118/70   Pulse 84   Ht 6\' 2"  (1.88 m)   Wt 219 lb 6.4 oz (99.5 kg)   SpO2 94%   BMI 28.17 kg/m , BMI Body mass index is 28.17 kg/m.  Wt Readings from Last 3 Encounters:  01/31/23 219 lb 6.4 oz (99.5 kg)  12/14/22 232 lb 12.8 oz (105.6 kg)  04/12/22 260 lb 4.8 oz (118.1 kg)    General: Patient appears comfortable at rest. HEENT: Conjunctiva and lids normal, oropharynx clear  with moist mucosa. Neck: Supple, no elevated JVP or carotid bruits, no thyromegaly. Lungs: Clear to auscultation, nonlabored breathing at rest. Cardiac: Regular rate and rhythm, no S3 or significant systolic murmur, no pericardial rub. Abdomen: Soft, nontender, no hepatomegaly, bowel sounds present, no guarding or rebound. Extremities: 2+ pitting edema Skin: Warm and dry. Musculoskeletal: No kyphosis. Neuropsychiatric: Alert and oriented x3, affect grossly appropriate.  Recent Labwork: 06/16/2022: ALT 13; AST 11; BUN 19; Creat 0.94; Potassium 4.7; Sodium 138  No results found for: "CHOL", "TRIG", "HDL", "CHOLHDL", "VLDL", "LDLCALC", "LDLDIRECT"   Assessment and  Plan: Patient is a 72 year old M with HTN was referred to cardiology clinic for evaluation of DOE.  # Acute on chronic HFpEF exacerbation, compensated -Patient has symptoms of DOE, orthopnea, PND and bilateral lower extremity swelling x 1 month and 5/24 clinic visit.  He was originally on p.o. Lasix 60 mg twice daily which he self decreased to p.o. Lasix 20 mg once daily with significant improvement in his symptoms.  As HFpEF is a anginal equivalent, he would benefit from noninvasive ischemia evaluation with a stress testing.  However due to his cervical stenosis, recommended Lexiscan instead of exercise Myoview. The patient is not comfortable to undergo Lexiscan. Will obtain CTA cardiac instead to rule out any severe CAD. -Otherwise, we will continue p.o. Lasix 20 mg once daily and start Farxiga 10 mg once daily.  Obtain BMP in 5 days.  # HTN, controlled -Continue p.o. Lasix 20 mg once daily.  Discontinued HCTZ previously and patient not taking amlodipine anymore.   I have spent a total of 30 minutes with patient reviewing chart, EKGs, labs and examining patient as well as establishing an assessment and plan that was discussed with the patient.  > 50% of time was spent in direct patient care.    Medication Adjustments/Labs and Tests Ordered: Current medicines are reviewed at length with the patient today.  Concerns regarding medicines are outlined above.   Tests Ordered: No orders of the defined types were placed in this encounter.   Medication Changes: No orders of the defined types were placed in this encounter.   Disposition:  Follow up  6 months  Signed, Gaetano Romberger Verne Spurr, MD, 01/31/2023 10:30 AM    [facility] at [facility] [address], [place], [place] [address]

## 2023-02-01 ENCOUNTER — Telehealth: Payer: Self-pay | Admitting: Internal Medicine

## 2023-02-01 NOTE — Telephone Encounter (Signed)
Answered all questions, mainly if he would need farxiga for the rest of his life and I tole him probably

## 2023-02-01 NOTE — Telephone Encounter (Signed)
Patient called to talk with Dr. Jenene Slicker or nurse in regards to a question patient forgot to ask while was there on 01/31/23

## 2023-02-11 ENCOUNTER — Other Ambulatory Visit (HOSPITAL_COMMUNITY)
Admission: RE | Admit: 2023-02-11 | Discharge: 2023-02-11 | Disposition: A | Discharge status: Home / Self Care | Payer: Medicare HMO | Source: Ambulatory Visit | Attending: Internal Medicine | Admitting: Internal Medicine

## 2023-02-11 DIAGNOSIS — R0609 Other forms of dyspnea: Secondary | ICD-10-CM | POA: Insufficient documentation

## 2023-02-11 DIAGNOSIS — I5020 Unspecified systolic (congestive) heart failure: Secondary | ICD-10-CM | POA: Insufficient documentation

## 2023-02-11 DIAGNOSIS — I509 Heart failure, unspecified: Secondary | ICD-10-CM | POA: Insufficient documentation

## 2023-02-11 LAB — BASIC METABOLIC PANEL
Anion gap: 11 (ref 5–15)
BUN: 19 mg/dL (ref 8–23)
CO2: 27 mmol/L (ref 22–32)
Calcium: 9.3 mg/dL (ref 8.9–10.3)
Chloride: 98 mmol/L (ref 98–111)
Creatinine, Ser: 1.2 mg/dL (ref 0.61–1.24)
GFR, Estimated: >60 mL/min (ref >60)
Glucose, Bld: 154 mg/dL — ABNORMAL HIGH (ref 70–99)
Potassium: 4.1 mmol/L (ref 3.5–5.1)
Sodium: 136 mmol/L (ref 135–145)

## 2023-02-18 ENCOUNTER — Ambulatory Visit (HOSPITAL_COMMUNITY): Payer: Medicare HMO

## 2023-02-24 DIAGNOSIS — G8929 Other chronic pain: Secondary | ICD-10-CM | POA: Diagnosis not present

## 2023-02-24 DIAGNOSIS — M459 Ankylosing spondylitis of unspecified sites in spine: Secondary | ICD-10-CM | POA: Diagnosis not present

## 2023-02-24 DIAGNOSIS — I700 Atherosclerosis of aorta: Secondary | ICD-10-CM | POA: Diagnosis not present

## 2023-02-24 DIAGNOSIS — M179 Osteoarthritis of knee, unspecified: Secondary | ICD-10-CM | POA: Diagnosis not present

## 2023-02-24 DIAGNOSIS — F1120 Opioid dependence, uncomplicated: Secondary | ICD-10-CM | POA: Diagnosis not present

## 2023-02-24 DIAGNOSIS — I10 Essential (primary) hypertension: Secondary | ICD-10-CM | POA: Diagnosis not present

## 2023-02-24 DIAGNOSIS — E6609 Other obesity due to excess calories: Secondary | ICD-10-CM | POA: Diagnosis not present

## 2023-02-24 DIAGNOSIS — Z6830 Body mass index (BMI) 30.0-30.9, adult: Secondary | ICD-10-CM | POA: Diagnosis not present

## 2023-02-25 ENCOUNTER — Telehealth (HOSPITAL_COMMUNITY): Payer: Self-pay | Admitting: Emergency Medicine

## 2023-02-25 NOTE — Telephone Encounter (Signed)
Reaching out to patient to offer assistance regarding upcoming cardiac imaging study; pt verbalizes understanding of appt date/time, parking situation and where to check in, pre-test NPO status and medications ordered, and verified current allergies; name and call back number provided for further questions should they arise Sara Wallace RN Navigator Cardiac Imaging [facility] [phone] office [phone] cell 

## 2023-02-28 ENCOUNTER — Ambulatory Visit (HOSPITAL_BASED_OUTPATIENT_CLINIC_OR_DEPARTMENT_OTHER)
Admission: RE | Admit: 2023-02-28 | Discharge: 2023-02-28 | Disposition: A | Discharge status: Home / Self Care | Payer: Medicare HMO | Source: Ambulatory Visit | Attending: Internal Medicine | Admitting: Internal Medicine

## 2023-02-28 ENCOUNTER — Ambulatory Visit (HOSPITAL_COMMUNITY)
Admission: RE | Admit: 2023-02-28 | Discharge: 2023-02-28 | Disposition: A | Discharge status: Home / Self Care | Payer: Medicare HMO | Source: Ambulatory Visit | Attending: Internal Medicine | Admitting: Internal Medicine

## 2023-02-28 ENCOUNTER — Other Ambulatory Visit: Payer: Self-pay | Admitting: Internal Medicine

## 2023-02-28 DIAGNOSIS — R931 Abnormal findings on diagnostic imaging of heart and coronary circulation: Secondary | ICD-10-CM

## 2023-02-28 DIAGNOSIS — I5020 Unspecified systolic (congestive) heart failure: Secondary | ICD-10-CM | POA: Diagnosis not present

## 2023-02-28 DIAGNOSIS — I509 Heart failure, unspecified: Secondary | ICD-10-CM | POA: Diagnosis not present

## 2023-02-28 DIAGNOSIS — I2510 Atherosclerotic heart disease of native coronary artery without angina pectoris: Secondary | ICD-10-CM

## 2023-02-28 DIAGNOSIS — R0609 Other forms of dyspnea: Secondary | ICD-10-CM | POA: Diagnosis not present

## 2023-02-28 MED ORDER — NITROGLYCERIN 0.4 MG SL SUBL
0.8000 mg | SUBLINGUAL_TABLET | SUBLINGUAL | Status: DC | PRN
  Administered 2023-02-28: 0.8 mg via SUBLINGUAL

## 2023-02-28 MED ORDER — IOHEXOL 350 MG/ML SOLN
100.0000 mL | Freq: Once | INTRAVENOUS | Status: AC | PRN
  Administered 2023-02-28: 100 mL via INTRAVENOUS

## 2023-02-28 MED ORDER — NITROGLYCERIN 0.4 MG SL SUBL
SUBLINGUAL_TABLET | SUBLINGUAL | Status: AC
  Filled 2023-02-28: qty 2

## 2023-02-28 NOTE — Progress Notes (Signed)
CT sent for FFR 

## 2023-03-04 ENCOUNTER — Telehealth: Payer: Self-pay | Admitting: Internal Medicine

## 2023-03-04 NOTE — Telephone Encounter (Signed)
Burns, Francisco Modest, MD  [name], CMA Coronary artery calcium score is 4178 (more than 400 is considered to be significant).  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 Please schedule the patient to be seen with me or APP in 1-2 weeks to set up for LHC.          Apt scheduled with Dr.Mallipeddi on 03/17/23 at 11 am in [place] office.    I spoke with patient and discussed ct results. He will follow up with Dr.Mallipeddi to discuss cardiac cat, pcp copied

## 2023-03-04 NOTE — Telephone Encounter (Signed)
Patient returned staff call regarding results.

## 2023-03-17 ENCOUNTER — Encounter: Payer: Self-pay | Admitting: Internal Medicine

## 2023-03-17 ENCOUNTER — Ambulatory Visit: Payer: Medicare HMO | Attending: Internal Medicine | Admitting: Internal Medicine

## 2023-03-17 VITALS — BP 120/70 | HR 86 | Ht 74.0 in | Wt 221.8 lb

## 2023-03-17 DIAGNOSIS — I2510 Atherosclerotic heart disease of native coronary artery without angina pectoris: Secondary | ICD-10-CM | POA: Diagnosis not present

## 2023-03-17 MED ORDER — ASPIRIN 81 MG PO TBEC: 81.0000 mg | DELAYED_RELEASE_TABLET | Freq: Every day | ORAL | Status: AC

## 2023-03-17 MED ORDER — SPIRONOLACTONE 25 MG PO TABS: 25.0000 mg | ORAL_TABLET | Freq: Every day | ORAL | 3 refills | Status: DC

## 2023-03-17 MED ORDER — ROSUVASTATIN CALCIUM 20 MG PO TABS: 20.0000 mg | ORAL_TABLET | Freq: Every day | ORAL | 3 refills | Status: DC

## 2023-03-17 NOTE — Progress Notes (Signed)
Cardiology Office Note  Date: 03/17/2023   ID: Francisco Burns, DOB 1951/06/24, MRN [id number]  PCP:  Elfredia Nevins, MD  Cardiologist:  Marjo Bicker, MD Electrophysiologist:  None    History of Present Illness: Francisco Burns is a 72 y.o. male with HTN, nicotine abuse is here for follow-up visit.  Patient was referred to cardiology clinic for evaluation of DOE, orthopnea, PND and bilateral lower extremity swelling x 1 month.  Echocardiogram in 6/24 showed normal LVEF, G1 DD and no significant valvular heart disease.  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  FFR was positive for mid LCx stenosis.  He is here for follow-up visit.  No interval DOE, angina, presyncope, syncope, leg swelling, orthopnea or PND.  Compliant with medications and has no side effects.  Past Medical History:  Diagnosis Date   Hepatitis C    Skin cancer    Thyroid condition     Past Surgical History:  Procedure Laterality Date   BACK SURGERY     TONSILLECTOMY AND ADENOIDECTOMY      Current Outpatient Medications  Medication Sig Dispense Refill   aspirin EC 81 MG tablet Take 1 tablet (81 mg total) by mouth daily. Swallow whole.     dapagliflozin propanediol (FARXIGA) 10 MG TABS tablet Take 1 tablet (10 mg total) by mouth daily before breakfast. 28 tablet 0   furosemide (LASIX) 20 MG tablet Take 1 tablet (20 mg total) by mouth daily. 90 tablet 3   metoprolol tartrate (LOPRESSOR) 100 MG tablet TAKE 100 MG (1 TABLET) 2 HOURS BEFORE CARDIAC CT 1 tablet 0   oxycodone (ROXICODONE) 30 MG immediate release tablet Take 30 mg by mouth every 4 (four) hours.     potassium chloride SA (KLOR-CON M) 20 MEQ tablet Take 1 tablet (20 mEq total) by mouth daily. (Patient taking differently: Take 10 mEq by mouth daily.) 90 tablet 3   rosuvastatin (CRESTOR) 20 MG tablet Take 1 tablet (20 mg total) by mouth at  bedtime. 90 tablet 3   spironolactone (ALDACTONE) 25 MG tablet Take 1 tablet (25 mg total) by mouth daily. 90 tablet 3   albuterol (PROVENTIL) 2 MG tablet Take 2 mg by mouth 3 (three) times daily. (Patient not taking: Reported on 01/31/2023)     levothyroxine (SYNTHROID) 25 MCG tablet Take 25 mcg by mouth daily before breakfast. (Patient not taking: Reported on 03/17/2023)     No current facility-administered medications for this visit.   Allergies:  Patient has no known allergies.   Social History: The patient  reports that he has quit smoking. His smoking use included e-cigarettes. He has been exposed to tobacco smoke. He has never used smokeless tobacco. He reports that he does not currently use alcohol. He reports that he does not use drugs.   Family History: The patient's family history is not on file.   ROS:  Please see the history of present illness. Otherwise, complete review of systems is positive for none.  All other systems are reviewed and negative.   Physical Exam: VS:  BP 120/70   Pulse 86   Ht 6\' 2"  (1.88 m)   Wt 221 lb 12.8 oz (100.6 kg)   SpO2 96%   BMI 28.48 kg/m , BMI Body mass index is 28.48 kg/m.  Wt Readings from Last 3 Encounters:  03/17/23 221 lb 12.8 oz (100.6 kg)  01/31/23 219 lb 6.4 oz (99.5 kg)  12/14/22 232 lb 12.8 oz (105.6 kg)    General: Patient appears comfortable at rest. HEENT: Conjunctiva and lids normal, oropharynx clear with moist mucosa. Neck: Supple, no elevated JVP or carotid bruits, no thyromegaly. Lungs: Clear to auscultation, nonlabored breathing at rest. Cardiac: Regular rate and rhythm, no S3 or significant systolic murmur, no pericardial rub. Abdomen: Soft, nontender, no hepatomegaly, bowel sounds present, no guarding or rebound. Extremities: 2+ pitting edema Skin: Warm and dry. Musculoskeletal: No kyphosis. Neuropsychiatric: Alert and oriented x3, affect grossly appropriate.  Recent Labwork: 06/16/2022: ALT 13; AST 11 02/11/2023:  BUN 19; Creatinine, Ser 1.20; Potassium 4.1; Sodium 136  No results found for: "CHOL", "TRIG", "HDL", "CHOLHDL", "VLDL", "LDLCALC", "LDLDIRECT"   Assessment and Plan:   # Chronic diastolic heart failure # Positive CCTA (FFR positive for mid LCx lesion, moderate to severe LAD disease) -Patient had symptoms of DOE, orthopnea, PND and bilateral lower extremity swelling x 1 month during 5/24 clinic visit that were resolved with p.o. diuretics. Echocardiogram in 6/24 showed normal LVEF, G1 DD and no significant valvular heart disease. 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  FFR was positive for mid LCx stenosis.  Start aspirin 81 mg once daily, start rosuvastatin 20 mg nightly, discontinue amlodipine and start spironolactone 25 mg once daily.  Continue p.o. Lasix 20 mg once daily and Farxiga 10 mg once daily. -Patient will benefit from [facility] due to HFpEF exacerbation in 5/24 and positive CCTA findings. Risks and benefits of cardiac catheterization have been discussed with the patient.  These include bleeding, infection, kidney damage, stroke, heart attack, death.  The patient understands these risks and is willing to proceed. -Patient stated he needs to talk to a lawyer, take care of his will.  Once this is taken care of, he is instructed to call the clinic and schedule for LHC.  # HTN, controlled -Continue home medications   Medication Adjustments/Labs and Tests Ordered: Current medicines are reviewed at length with the patient today.  Concerns regarding medicines are outlined above.   Tests Ordered: No orders of the defined types were placed in this encounter.   Medication Changes: Meds ordered this encounter  Medications   aspirin EC 81 MG tablet    Sig: Take 1 tablet (81 mg total) by mouth daily. Swallow whole.   spironolactone (ALDACTONE) 25 MG tablet    Sig: Take 1 tablet (25 mg  total) by mouth daily.    Dispense:  90 tablet    Refill:  3   rosuvastatin (CRESTOR) 20 MG tablet    Sig: Take 1 tablet (20 mg total) by mouth at bedtime.    Dispense:  90 tablet    Refill:  3    Disposition:  Follow up  3 months  Signed, Rowynn Mcweeney Verne Spurr, MD, 03/17/2023 12:24 PM    [facility] at [facility] [address] [place], [place] [address]

## 2023-03-17 NOTE — Patient Instructions (Addendum)
Medication Instructions:  Your physician has recommended you make the following change in your medication:   -Stop Amlodipine -Start Crestor 20 mg tablet once daily- at bedtime -Start Aspirin 81 mg tablet once daily.  -Start Spironolactone 25 mg tablet once daily.   *If you need a refill on your cardiac medications before your next appointment, please call your pharmacy*   Lab Work: None If you have labs (blood work) drawn today and your tests are completely normal, you will receive your results only by: MyChart Message (if you have MyChart) OR A paper copy in the mail If you have any lab test that is abnormal or we need to change your treatment, we will call you to review the results.   Testing/Procedures: None   Follow-Up: At [facility], you and your health needs are our priority.  As part of our continuing mission to provide you with exceptional heart care, we have created designated Provider Care Teams.  These Care Teams include your primary Cardiologist (physician) and Advanced Practice Providers (APPs -  Physician Assistants and Nurse Practitioners) who all work together to provide you with the care you need, when you need it.  We recommend signing up for the patient portal called "MyChart".  Sign up information is provided on this After Visit Summary.  MyChart is used to connect with patients for Virtual Visits (Telemedicine).  Patients are able to view lab/test results, encounter notes, upcoming appointments, etc.  Non-urgent messages can be sent to your provider as well.   To learn more about what you can do with MyChart, go to [email or web address].    Your next appointment:   3 month(s)  Provider:   Luane School, MD    Other Instructions  If you start having shortness of breath or chest pains, please proceed to your nearest emergency department or call 911.

## 2023-03-21 DIAGNOSIS — I10 Essential (primary) hypertension: Secondary | ICD-10-CM | POA: Diagnosis not present

## 2023-03-21 DIAGNOSIS — M459 Ankylosing spondylitis of unspecified sites in spine: Secondary | ICD-10-CM | POA: Diagnosis not present

## 2023-03-21 DIAGNOSIS — G8929 Other chronic pain: Secondary | ICD-10-CM | POA: Diagnosis not present

## 2023-03-21 DIAGNOSIS — F1120 Opioid dependence, uncomplicated: Secondary | ICD-10-CM | POA: Diagnosis not present

## 2023-04-07 DIAGNOSIS — R0600 Dyspnea, unspecified: Secondary | ICD-10-CM | POA: Diagnosis not present

## 2023-04-07 DIAGNOSIS — J209 Acute bronchitis, unspecified: Secondary | ICD-10-CM | POA: Diagnosis not present

## 2023-04-07 DIAGNOSIS — R600 Localized edema: Secondary | ICD-10-CM | POA: Diagnosis not present

## 2023-04-07 DIAGNOSIS — I10 Essential (primary) hypertension: Secondary | ICD-10-CM | POA: Diagnosis not present

## 2023-04-07 DIAGNOSIS — F1120 Opioid dependence, uncomplicated: Secondary | ICD-10-CM | POA: Diagnosis not present

## 2023-04-07 DIAGNOSIS — I700 Atherosclerosis of aorta: Secondary | ICD-10-CM | POA: Diagnosis not present

## 2023-04-07 DIAGNOSIS — G8929 Other chronic pain: Secondary | ICD-10-CM | POA: Diagnosis not present

## 2023-04-07 DIAGNOSIS — M459 Ankylosing spondylitis of unspecified sites in spine: Secondary | ICD-10-CM | POA: Diagnosis not present

## 2023-04-07 DIAGNOSIS — T50905A Adverse effect of unspecified drugs, medicaments and biological substances, initial encounter: Secondary | ICD-10-CM | POA: Diagnosis not present

## 2023-04-18 DIAGNOSIS — F1120 Opioid dependence, uncomplicated: Secondary | ICD-10-CM | POA: Diagnosis not present

## 2023-04-18 DIAGNOSIS — Z6829 Body mass index (BMI) 29.0-29.9, adult: Secondary | ICD-10-CM | POA: Diagnosis not present

## 2023-04-18 DIAGNOSIS — E6609 Other obesity due to excess calories: Secondary | ICD-10-CM | POA: Diagnosis not present

## 2023-04-18 DIAGNOSIS — G8929 Other chronic pain: Secondary | ICD-10-CM | POA: Diagnosis not present

## 2023-04-18 DIAGNOSIS — K140 Glossitis: Secondary | ICD-10-CM | POA: Diagnosis not present

## 2023-05-02 DIAGNOSIS — F1120 Opioid dependence, uncomplicated: Secondary | ICD-10-CM | POA: Diagnosis not present

## 2023-05-02 DIAGNOSIS — E039 Hypothyroidism, unspecified: Secondary | ICD-10-CM | POA: Diagnosis not present

## 2023-05-02 DIAGNOSIS — I10 Essential (primary) hypertension: Secondary | ICD-10-CM | POA: Diagnosis not present

## 2023-05-03 DIAGNOSIS — M459 Ankylosing spondylitis of unspecified sites in spine: Secondary | ICD-10-CM | POA: Diagnosis not present

## 2023-05-03 DIAGNOSIS — F1120 Opioid dependence, uncomplicated: Secondary | ICD-10-CM | POA: Diagnosis not present

## 2023-05-03 DIAGNOSIS — G8929 Other chronic pain: Secondary | ICD-10-CM | POA: Diagnosis not present

## 2023-05-03 DIAGNOSIS — I10 Essential (primary) hypertension: Secondary | ICD-10-CM | POA: Diagnosis not present

## 2023-06-14 DIAGNOSIS — F1120 Opioid dependence, uncomplicated: Secondary | ICD-10-CM | POA: Diagnosis not present

## 2023-06-14 DIAGNOSIS — M459 Ankylosing spondylitis of unspecified sites in spine: Secondary | ICD-10-CM | POA: Diagnosis not present

## 2023-06-14 DIAGNOSIS — I110 Hypertensive heart disease with heart failure: Secondary | ICD-10-CM | POA: Diagnosis not present

## 2023-06-14 DIAGNOSIS — I10 Essential (primary) hypertension: Secondary | ICD-10-CM | POA: Diagnosis not present

## 2023-06-14 DIAGNOSIS — G8929 Other chronic pain: Secondary | ICD-10-CM | POA: Diagnosis not present

## 2023-06-24 ENCOUNTER — Ambulatory Visit: Payer: Medicare HMO | Admitting: Internal Medicine

## 2023-06-28 DIAGNOSIS — G8929 Other chronic pain: Secondary | ICD-10-CM | POA: Diagnosis not present

## 2023-06-28 DIAGNOSIS — M459 Ankylosing spondylitis of unspecified sites in spine: Secondary | ICD-10-CM | POA: Diagnosis not present

## 2023-06-28 DIAGNOSIS — I110 Hypertensive heart disease with heart failure: Secondary | ICD-10-CM | POA: Diagnosis not present

## 2023-06-28 DIAGNOSIS — Z6831 Body mass index (BMI) 31.0-31.9, adult: Secondary | ICD-10-CM | POA: Diagnosis not present

## 2023-06-28 DIAGNOSIS — F1120 Opioid dependence, uncomplicated: Secondary | ICD-10-CM | POA: Diagnosis not present

## 2023-06-28 DIAGNOSIS — E6609 Other obesity due to excess calories: Secondary | ICD-10-CM | POA: Diagnosis not present

## 2023-06-28 DIAGNOSIS — I10 Essential (primary) hypertension: Secondary | ICD-10-CM | POA: Diagnosis not present

## 2023-06-29 ENCOUNTER — Ambulatory Visit: Payer: Medicare HMO | Attending: Internal Medicine | Admitting: Internal Medicine

## 2023-06-29 ENCOUNTER — Encounter: Payer: Self-pay | Admitting: Internal Medicine

## 2023-06-29 VITALS — BP 116/74 | HR 65 | Ht 74.0 in | Wt 225.0 lb

## 2023-06-29 DIAGNOSIS — I2510 Atherosclerotic heart disease of native coronary artery without angina pectoris: Secondary | ICD-10-CM | POA: Diagnosis not present

## 2023-06-29 DIAGNOSIS — Z720 Tobacco use: Secondary | ICD-10-CM | POA: Diagnosis not present

## 2023-06-29 NOTE — Patient Instructions (Signed)
Medication Instructions:  Your physician recommends that you continue on your current medications as directed. Please refer to the Current Medication list given to you today.  *If you need a refill on your cardiac medications before your next appointment, please call your pharmacy*   Lab Work: None If you have labs (blood work) drawn today and your tests are completely normal, you will receive your results only by: MyChart Message (if you have MyChart) OR A paper copy in the mail If you have any lab test that is abnormal or we need to change your treatment, we will call you to review the results.   Testing/Procedures: None   Follow-Up: At [facility], you and your health needs are our priority.  As part of our continuing mission to provide you with exceptional heart care, we have created designated Provider Care Teams.  These Care Teams include your primary Cardiologist (physician) and Advanced Practice Providers (APPs -  Physician Assistants and Nurse Practitioners) who all work together to provide you with the care you need, when you need it.  We recommend signing up for the patient portal called "MyChart".  Sign up information is provided on this After Visit Summary.  MyChart is used to connect with patients for Virtual Visits (Telemedicine).  Patients are able to view lab/test results, encounter notes, upcoming appointments, etc.  Non-urgent messages can be sent to your provider as well.   To learn more about what you can do with MyChart, go to [email or web address].    Your next appointment:   2 week(s)  Provider:   You may see Vishnu Norton Pastel, MD   Other Instructions

## 2023-06-29 NOTE — Progress Notes (Signed)
Cardiology Office Note  Date: 06/29/2023   ID: Francisco Burns, DOB 06/20/1951, MRN [id number]  PCP:  Elfredia Nevins, MD  Cardiologist:  Marjo Bicker, MD Electrophysiologist:  None    History of Present Illness: Francisco Burns is a 72 y.o. male with HTN, nicotine abuse is here for follow-up visit.  Patient was referred to cardiology clinic for evaluation of DOE, orthopnea, PND and bilateral lower extremity swelling x 1 month.  Echocardiogram in 6/24 showed normal LVEF, G1 DD and no significant valvular heart disease.  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  FFR was positive for mid LCx stenosis.  He is here for follow-up visit.  He was supposed to call the clinic to schedule for LHC last 3 months but he did not.  He said he needs to meet up his lawyer to take care of the real/trust which is still pending today.  No symptoms, no angina, DOE, orthopnea, PND, leg swelling, palpitations or syncope.  He reported intermittent leg swelling that resolves when he takes the diuretics.  Past Medical History:  Diagnosis Date   Hepatitis C    Skin cancer    Thyroid condition     Past Surgical History:  Procedure Laterality Date   BACK SURGERY     TONSILLECTOMY AND ADENOIDECTOMY      Current Outpatient Medications  Medication Sig Dispense Refill   aspirin EC 81 MG tablet Take 1 tablet (81 mg total) by mouth daily. Swallow whole.     dapagliflozin propanediol (FARXIGA) 10 MG TABS tablet Take 1 tablet (10 mg total) by mouth daily before breakfast. 28 tablet 0   furosemide (LASIX) 20 MG tablet Take 1 tablet (20 mg total) by mouth daily. 90 tablet 3   levothyroxine (SYNTHROID) 25 MCG tablet Take 25 mcg by mouth daily before breakfast.     oxycodone (ROXICODONE) 30 MG immediate release tablet Take 30 mg by mouth every 4 (four) hours.     potassium chloride SA (KLOR-CON M) 20 MEQ  tablet Take 1 tablet (20 mEq total) by mouth daily. (Patient taking differently: Take 10 mEq by mouth daily.) 90 tablet 3   rosuvastatin (CRESTOR) 20 MG tablet Take 1 tablet (20 mg total) by mouth at bedtime. 90 tablet 3   spironolactone (ALDACTONE) 25 MG tablet Take 1 tablet (25 mg total) by mouth daily. 90 tablet 3   albuterol (PROVENTIL) 2 MG tablet Take 2 mg by mouth 3 (three) times daily. (Patient not taking: Reported on 01/31/2023)     No current facility-administered medications for this visit.   Allergies:  Patient has no known allergies.   Social History: The patient  reports that he has quit smoking. His smoking use included e-cigarettes. He has been exposed to tobacco smoke. He has never used smokeless tobacco. He reports that he does not currently use alcohol. He reports that he does not use drugs.   Family History: The patient's family history is not on file.   ROS:  Please see the history of present illness. Otherwise, complete review of systems is positive for none.  All other systems are reviewed and negative.   Physical Exam: VS:  BP 116/74   Pulse 65   Ht 6\' 2"  (1.88 m)   Wt 225 lb (102.1 kg)   SpO2 97%   BMI 28.89 kg/m , BMI Body mass index is 28.89 kg/m.  Wt Readings from Last 3 Encounters:  06/29/23 225 lb (102.1 kg)  03/17/23 221 lb 12.8 oz (100.6 kg)  01/31/23 219 lb 6.4 oz (99.5 kg)    General: Patient appears comfortable at rest. HEENT: Conjunctiva and lids normal, oropharynx clear with moist mucosa. Neck: Supple, no elevated JVP or carotid bruits, no thyromegaly. Lungs: Clear to auscultation, nonlabored breathing at rest. Cardiac: Regular rate and rhythm, no S3 or significant systolic murmur, no pericardial rub. Abdomen: Soft, nontender, no hepatomegaly, bowel sounds present, no guarding or rebound. Extremities: 2+ pitting edema Skin: Warm and dry. Musculoskeletal: No kyphosis. Neuropsychiatric: Alert and oriented x3, affect grossly  appropriate.  Recent Labwork: 02/11/2023: BUN 19; Creatinine, Ser 1.20; Potassium 4.1; Sodium 136  No results found for: "CHOL", "TRIG", "HDL", "CHOLHDL", "VLDL", "LDLCALC", "LDLDIRECT"   Assessment and Plan:   # Chronic diastolic heart failure, compensated # Positive CCTA (FFR positive for mid LCx lesion, moderate to severe LAD disease) -Patient had symptoms of DOE, orthopnea, PND and bilateral lower extremity swelling x 1 month during 5/24 clinic visit that were resolved with p.o. diuretics. Echocardiogram in 6/24 showed normal LVEF, G1 DD and no significant valvular heart disease. 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  FFR was positive for mid LCx stenosis.  He will benefit from invasive ischemia evaluation with LHC however he wants to wait until will/trust work is taken care of.  I will schedule a telephone visit in 2 weeks for his decision. -Risks and benefits of cardiac catheterization have been discussed with the patient.  These include bleeding, infection, kidney damage, stroke, heart attack, death.  The patient understands these risks and is willing to proceed. -Continue cardioprotective medications, aspirin 81 mg once daily, rosuvastatin 20 mg nightly. -Continue medications for HFpEF, p.o. Lasix 20 mg once daily, Farxiga 10 mg once daily and spironolactone 25 mg once daily.  # Nicotine abuse -Cut back smoking half pack cigarettes.  Congratulated.  Continue smoking cessation.  # HTN, controlled -Continue home medications.   I spent 30 minutes discussing CCTA findings and emphasized importance of LHC.  Medications reviewed, documented the findings in the note.   Medication Adjustments/Labs and Tests Ordered: Current medicines are reviewed at length with the patient today.  Concerns regarding medicines are outlined above.   Tests Ordered: Orders Placed This Encounter   Procedures   EKG 12-Lead    Medication Changes: No orders of the defined types were placed in this encounter.   Disposition:  Follow up  2 weeks telephone visit  Signed, Venola Castello Verne Spurr, MD, 06/29/2023 9:29 AM    [facility] at [facility] [address], [place], [place] [address]

## 2023-07-02 DIAGNOSIS — E039 Hypothyroidism, unspecified: Secondary | ICD-10-CM | POA: Diagnosis not present

## 2023-07-02 DIAGNOSIS — F1120 Opioid dependence, uncomplicated: Secondary | ICD-10-CM | POA: Diagnosis not present

## 2023-07-02 DIAGNOSIS — I10 Essential (primary) hypertension: Secondary | ICD-10-CM | POA: Diagnosis not present

## 2023-07-02 DIAGNOSIS — I110 Hypertensive heart disease with heart failure: Secondary | ICD-10-CM | POA: Diagnosis not present

## 2023-07-13 ENCOUNTER — Ambulatory Visit: Payer: Medicare HMO | Admitting: Internal Medicine

## 2023-07-22 ENCOUNTER — Encounter: Payer: Self-pay | Admitting: Internal Medicine

## 2023-07-22 ENCOUNTER — Ambulatory Visit: Payer: Medicare HMO | Admitting: Internal Medicine

## 2023-07-22 ENCOUNTER — Ambulatory Visit: Payer: Medicare HMO | Attending: Internal Medicine | Admitting: Internal Medicine

## 2023-07-22 ENCOUNTER — Telehealth: Payer: Self-pay

## 2023-07-22 DIAGNOSIS — I5032 Chronic diastolic (congestive) heart failure: Secondary | ICD-10-CM

## 2023-07-22 NOTE — Progress Notes (Signed)
Virtual Visit via Telephone Note   Because of Francisco Burns's co-morbid illnesses, he is at least at moderate risk for complications without adequate follow up.  This format is felt to be most appropriate for this patient at this time.  The patient did not have access to video technology/had technical difficulties with video requiring transitioning to audio format only (telephone).  All issues noted in this document were discussed and addressed.  No physical exam could be performed with this format.  Please refer to the patient's chart for his consent to telehealth for [facility].    Date:  07/22/2023   ID:  Francisco Burns, DOB 30-Apr-1951, MRN [id number] The patient was identified using 2 identifiers.  Patient Location: Home Provider Location: Office/Clinic   PCP:  Elfredia Nevins, MD   [facility] Providers Cardiologist:  Marjo Bicker, MD     Evaluation Performed:  Follow-Up Visit  Chief Complaint:  LHC  History of Present Illness:    Francisco Burns is a 72 y.o. male with chronic diastolic heart failure, positive CCTA, nicotine abuse, HTN is here for follow-up visit to discuss LHC.  Patient wants to get LHC at [facility].  No symptoms.   Past Medical History:  Diagnosis Date   Hepatitis C    Skin cancer    Thyroid condition    Past Surgical History:  Procedure Laterality Date   BACK SURGERY     TONSILLECTOMY AND ADENOIDECTOMY       Current Meds  Medication Sig   aspirin EC 81 MG tablet Take 1 tablet (81 mg total) by mouth daily. Swallow whole.   dapagliflozin propanediol (FARXIGA) 10 MG TABS tablet Take 1 tablet (10 mg total) by mouth daily before breakfast.   furosemide (LASIX) 20 MG tablet Take 1 tablet (20 mg total) by mouth daily.   levothyroxine (SYNTHROID) 25 MCG tablet Take 25 mcg by mouth daily before breakfast.   oxycodone (ROXICODONE) 30 MG immediate release tablet Take 30 mg by mouth every 4 (four) hours.   potassium chloride  SA (KLOR-CON M) 20 MEQ tablet Take 1 tablet (20 mEq total) by mouth daily. (Patient taking differently: Take 10 mEq by mouth daily.)   rosuvastatin (CRESTOR) 20 MG tablet Take 1 tablet (20 mg total) by mouth at bedtime.   spironolactone (ALDACTONE) 25 MG tablet Take 1 tablet (25 mg total) by mouth daily.     Allergies:   Patient has no known allergies.   Social History   Tobacco Use   Smoking status: Former    Types: E-cigarettes    Passive exposure: Current   Smokeless tobacco: Never  Vaping Use   Vaping status: Every Day  Substance Use Topics   Alcohol use: Not Currently   Drug use: Never     Family Hx: The patient's family history is not on file.  ROS:   Please see the history of present illness.     All other systems reviewed and are negative.   Prior CV studies:   The following studies were reviewed today:    Labs/Other Tests and Data Reviewed:    EKG:    Recent Labs: 02/11/2023: BUN 19; Creatinine, Ser 1.20; Potassium 4.1; Sodium 136   Recent Lipid Panel No results found for: "CHOL", "TRIG", "HDL", "CHOLHDL", "LDLCALC", "LDLDIRECT"  Wt Readings from Last 3 Encounters:  07/22/23 230 lb (104.3 kg)  06/29/23 225 lb (102.1 kg)  03/17/23 221 lb 12.8 oz (100.6 kg)  Risk Assessment/Calculations:         Objective:    Vital Signs:  BP 136/68   Ht 6\' 2"  (1.88 m)   Wt 230 lb (104.3 kg)   BMI 29.53 kg/m      ASSESSMENT & PLAN:    Chronic diastolic heart failure Positive CCTA (FFR positive for mid LCx lesion, moderate to severe LAD disease) HTN Nicotine abuse   -Patient wants to get LHC at [facility].  I informed him that he will need to get a new cardiology referral from his PCP in order for him to be seen by a cardiologist at [facility] and will schedule for [facility].  He verbalized understanding.  Otherwise, continue current medications.        Time:   Today, I have spent 5 minutes with the patient with telehealth technology discussing the above  problems.     Medication Adjustments/Labs and Tests Ordered: Current medicines are reviewed at length with the patient today.  Concerns regarding medicines are outlined above.   Tests Ordered: No orders of the defined types were placed in this encounter.   Medication Changes: No orders of the defined types were placed in this encounter.   Follow Up:   6 months  Signed, Alaura Schippers P Cainen Burnham, MD  07/22/2023 7:59 PM    [facility]

## 2023-07-22 NOTE — Telephone Encounter (Signed)
  Patient Consent for Virtual Visit  [id number]}   Francisco Burns has provided verbal consent on 07/22/2023 for a virtual visit (video or telephone).   CONSENT FOR VIRTUAL VISIT FOR:  Francisco Burns  By participating in this virtual visit I agree to the following:  I hereby voluntarily request, consent and authorize [facility] and its employed or contracted physicians, physician assistants, nurse practitioners or other licensed health care professionals (the Practitioner), to provide me with telemedicine health care services (the "Services") as deemed necessary by the treating Practitioner. I acknowledge and consent to receive the Services by the Practitioner via telemedicine. I understand that the telemedicine visit will involve communicating with the Practitioner through live audiovisual communication technology and the disclosure of certain medical information by electronic transmission. I acknowledge that I have been given the opportunity to request an in-person assessment or other available alternative prior to the telemedicine visit and am voluntarily participating in the telemedicine visit.  I understand that I have the right to withhold or withdraw my consent to the use of telemedicine in the course of my care at any time, without affecting my right to future care or treatment, and that the Practitioner or I may terminate the telemedicine visit at any time. I understand that I have the right to inspect all information obtained and/or recorded in the course of the telemedicine visit and may receive copies of available information for a reasonable fee.  I understand that some of the potential risks of receiving the Services via telemedicine include:  Delay or interruption in medical evaluation due to technological equipment failure or disruption; Information transmitted may not be sufficient (e.g. poor resolution of images) to allow for appropriate medical decision making by the  Practitioner; and/or  In rare instances, security protocols could fail, causing a breach of personal health information.  Furthermore, I acknowledge that it is my responsibility to provide information about my medical history, conditions and care that is complete and accurate to the best of my ability. I acknowledge that Practitioner's advice, recommendations, and/or decision may be based on factors not within their control, such as incomplete or inaccurate data provided by me or distortions of diagnostic images or specimens that may result from electronic transmissions. I understand that the practice of medicine is not an exact science and that Practitioner makes no warranties or guarantees regarding treatment outcomes. I acknowledge that a copy of this consent can be made available to me via my patient portal [facility] MyChart), or I can request a printed copy by calling the office of  HeartCare.    I understand that my insurance will be billed for this visit.   I have read or had this consent read to me. I understand the contents of this consent, which adequately explains the benefits and risks of the Services being provided via telemedicine.  I have been provided ample opportunity to ask questions regarding this consent and the Services and have had my questions answered to my satisfaction. I give my informed consent for the services to be provided through the use of telemedicine in my medical care

## 2023-07-22 NOTE — Patient Instructions (Signed)
Medication Instructions:  Your physician recommends that you continue on your current medications as directed. Please refer to the Current Medication list given to you today.  *If you need a refill on your cardiac medications before your next appointment, please call your pharmacy*   Lab Work: None If you have labs (blood work) drawn today and your tests are completely normal, you will receive your results only by: MyChart Message (if you have MyChart) OR A paper copy in the mail If you have any lab test that is abnormal or we need to change your treatment, we will call you to review the results.   Testing/Procedures: None   Follow-Up: At [facility], you and your health needs are our priority.  As part of our continuing mission to provide you with exceptional heart care, we have created designated Provider Care Teams.  These Care Teams include your primary Cardiologist (physician) and Advanced Practice Providers (APPs -  Physician Assistants and Nurse Practitioners) who all work together to provide you with the care you need, when you need it.  We recommend signing up for the patient portal called "MyChart".  Sign up information is provided on this After Visit Summary.  MyChart is used to connect with patients for Virtual Visits (Telemedicine).  Patients are able to view lab/test results, encounter notes, upcoming appointments, etc.  Non-urgent messages can be sent to your provider as well.   To learn more about what you can do with MyChart, go to [email or web address].    Other Instructions

## 2023-08-08 DIAGNOSIS — M459 Ankylosing spondylitis of unspecified sites in spine: Secondary | ICD-10-CM | POA: Diagnosis not present

## 2023-08-08 DIAGNOSIS — I110 Hypertensive heart disease with heart failure: Secondary | ICD-10-CM | POA: Diagnosis not present

## 2023-08-08 DIAGNOSIS — G8929 Other chronic pain: Secondary | ICD-10-CM | POA: Diagnosis not present

## 2023-08-08 DIAGNOSIS — I2510 Atherosclerotic heart disease of native coronary artery without angina pectoris: Secondary | ICD-10-CM | POA: Diagnosis not present

## 2023-08-24 DIAGNOSIS — I110 Hypertensive heart disease with heart failure: Secondary | ICD-10-CM | POA: Diagnosis not present

## 2023-08-24 DIAGNOSIS — M459 Ankylosing spondylitis of unspecified sites in spine: Secondary | ICD-10-CM | POA: Diagnosis not present

## 2023-08-24 DIAGNOSIS — I10 Essential (primary) hypertension: Secondary | ICD-10-CM | POA: Diagnosis not present

## 2023-08-24 DIAGNOSIS — G8929 Other chronic pain: Secondary | ICD-10-CM | POA: Diagnosis not present

## 2023-09-02 ENCOUNTER — Other Ambulatory Visit: Payer: Self-pay

## 2023-09-02 DIAGNOSIS — Z13220 Encounter for screening for lipoid disorders: Secondary | ICD-10-CM | POA: Diagnosis not present

## 2023-09-02 MED ORDER — POTASSIUM CHLORIDE CRYS ER 20 MEQ PO TBCR: 20.0000 meq | EXTENDED_RELEASE_TABLET | Freq: Every day | ORAL | 3 refills | Status: DC

## 2023-09-07 DIAGNOSIS — I2510 Atherosclerotic heart disease of native coronary artery without angina pectoris: Secondary | ICD-10-CM | POA: Diagnosis not present

## 2023-09-19 DIAGNOSIS — G8929 Other chronic pain: Secondary | ICD-10-CM | POA: Diagnosis not present

## 2023-09-19 DIAGNOSIS — I110 Hypertensive heart disease with heart failure: Secondary | ICD-10-CM | POA: Diagnosis not present

## 2023-09-19 DIAGNOSIS — I10 Essential (primary) hypertension: Secondary | ICD-10-CM | POA: Diagnosis not present

## 2023-09-19 DIAGNOSIS — M459 Ankylosing spondylitis of unspecified sites in spine: Secondary | ICD-10-CM | POA: Diagnosis not present

## 2023-10-25 ENCOUNTER — Other Ambulatory Visit: Payer: Self-pay | Admitting: Internal Medicine

## 2023-10-31 DIAGNOSIS — M459 Ankylosing spondylitis of unspecified sites in spine: Secondary | ICD-10-CM | POA: Diagnosis not present

## 2023-10-31 DIAGNOSIS — Z0001 Encounter for general adult medical examination with abnormal findings: Secondary | ICD-10-CM | POA: Diagnosis not present

## 2023-10-31 DIAGNOSIS — Z9229 Personal history of other drug therapy: Secondary | ICD-10-CM | POA: Diagnosis not present

## 2023-10-31 DIAGNOSIS — E039 Hypothyroidism, unspecified: Secondary | ICD-10-CM | POA: Diagnosis not present

## 2023-10-31 DIAGNOSIS — G8929 Other chronic pain: Secondary | ICD-10-CM | POA: Diagnosis not present

## 2023-10-31 DIAGNOSIS — I10 Essential (primary) hypertension: Secondary | ICD-10-CM | POA: Diagnosis not present

## 2023-10-31 DIAGNOSIS — I110 Hypertensive heart disease with heart failure: Secondary | ICD-10-CM | POA: Diagnosis not present

## 2023-10-31 DIAGNOSIS — I2510 Atherosclerotic heart disease of native coronary artery without angina pectoris: Secondary | ICD-10-CM | POA: Diagnosis not present

## 2023-11-03 ENCOUNTER — Telehealth: Payer: Self-pay | Admitting: Internal Medicine

## 2023-11-03 DIAGNOSIS — Z9229 Personal history of other drug therapy: Secondary | ICD-10-CM | POA: Diagnosis not present

## 2023-11-03 DIAGNOSIS — Z0001 Encounter for general adult medical examination with abnormal findings: Secondary | ICD-10-CM | POA: Diagnosis not present

## 2023-11-03 DIAGNOSIS — I110 Hypertensive heart disease with heart failure: Secondary | ICD-10-CM | POA: Diagnosis not present

## 2023-11-03 DIAGNOSIS — E039 Hypothyroidism, unspecified: Secondary | ICD-10-CM | POA: Diagnosis not present

## 2023-11-03 DIAGNOSIS — E559 Vitamin D deficiency, unspecified: Secondary | ICD-10-CM | POA: Diagnosis not present

## 2023-11-03 DIAGNOSIS — R7309 Other abnormal glucose: Secondary | ICD-10-CM | POA: Diagnosis not present

## 2023-11-03 NOTE — Telephone Encounter (Signed)
 Patient wants a provider switch from Dr. Jenene Slicker in [place] to Dr. Jacinto Halim in [place].  Please confirm.

## 2023-11-03 NOTE — Telephone Encounter (Signed)
I am okay

## 2023-11-15 DIAGNOSIS — G8929 Other chronic pain: Secondary | ICD-10-CM | POA: Diagnosis not present

## 2023-11-15 DIAGNOSIS — M459 Ankylosing spondylitis of unspecified sites in spine: Secondary | ICD-10-CM | POA: Diagnosis not present

## 2023-11-15 DIAGNOSIS — I10 Essential (primary) hypertension: Secondary | ICD-10-CM | POA: Diagnosis not present

## 2023-11-22 ENCOUNTER — Other Ambulatory Visit: Payer: Self-pay | Admitting: Internal Medicine

## 2023-11-24 NOTE — Telephone Encounter (Signed)
 Dr. Mallipeddi saw pt last. Dr. Berry Bristol has never seen pt. Please refill pt's medication until appt with Dr. Berry Bristol. Thanks

## 2023-11-30 DIAGNOSIS — I2510 Atherosclerotic heart disease of native coronary artery without angina pectoris: Secondary | ICD-10-CM | POA: Diagnosis not present

## 2023-11-30 DIAGNOSIS — I110 Hypertensive heart disease with heart failure: Secondary | ICD-10-CM | POA: Diagnosis not present

## 2023-12-07 ENCOUNTER — Other Ambulatory Visit: Payer: Self-pay | Admitting: Internal Medicine

## 2023-12-08 DIAGNOSIS — R7303 Prediabetes: Secondary | ICD-10-CM | POA: Diagnosis not present

## 2023-12-08 DIAGNOSIS — E049 Nontoxic goiter, unspecified: Secondary | ICD-10-CM | POA: Diagnosis not present

## 2023-12-08 DIAGNOSIS — E039 Hypothyroidism, unspecified: Secondary | ICD-10-CM | POA: Diagnosis not present

## 2023-12-09 DIAGNOSIS — M459 Ankylosing spondylitis of unspecified sites in spine: Secondary | ICD-10-CM | POA: Diagnosis not present

## 2023-12-09 DIAGNOSIS — G8929 Other chronic pain: Secondary | ICD-10-CM | POA: Diagnosis not present

## 2023-12-09 DIAGNOSIS — I110 Hypertensive heart disease with heart failure: Secondary | ICD-10-CM | POA: Diagnosis not present

## 2023-12-09 DIAGNOSIS — I10 Essential (primary) hypertension: Secondary | ICD-10-CM | POA: Diagnosis not present

## 2023-12-28 DIAGNOSIS — G8929 Other chronic pain: Secondary | ICD-10-CM | POA: Diagnosis not present

## 2023-12-28 DIAGNOSIS — I10 Essential (primary) hypertension: Secondary | ICD-10-CM | POA: Diagnosis not present

## 2023-12-28 DIAGNOSIS — I110 Hypertensive heart disease with heart failure: Secondary | ICD-10-CM | POA: Diagnosis not present

## 2023-12-28 DIAGNOSIS — M459 Ankylosing spondylitis of unspecified sites in spine: Secondary | ICD-10-CM | POA: Diagnosis not present

## 2023-12-31 DIAGNOSIS — I2510 Atherosclerotic heart disease of native coronary artery without angina pectoris: Secondary | ICD-10-CM | POA: Diagnosis not present

## 2023-12-31 DIAGNOSIS — E039 Hypothyroidism, unspecified: Secondary | ICD-10-CM | POA: Diagnosis not present

## 2023-12-31 DIAGNOSIS — I110 Hypertensive heart disease with heart failure: Secondary | ICD-10-CM | POA: Diagnosis not present

## 2024-01-10 ENCOUNTER — Ambulatory Visit: Admitting: Cardiology

## 2024-01-23 DIAGNOSIS — I110 Hypertensive heart disease with heart failure: Secondary | ICD-10-CM | POA: Diagnosis not present

## 2024-01-23 DIAGNOSIS — I2510 Atherosclerotic heart disease of native coronary artery without angina pectoris: Secondary | ICD-10-CM | POA: Diagnosis not present

## 2024-01-23 DIAGNOSIS — G8929 Other chronic pain: Secondary | ICD-10-CM | POA: Diagnosis not present

## 2024-01-23 DIAGNOSIS — I10 Essential (primary) hypertension: Secondary | ICD-10-CM | POA: Diagnosis not present

## 2024-01-23 DIAGNOSIS — M459 Ankylosing spondylitis of unspecified sites in spine: Secondary | ICD-10-CM | POA: Diagnosis not present

## 2024-01-26 DIAGNOSIS — E559 Vitamin D deficiency, unspecified: Secondary | ICD-10-CM | POA: Diagnosis not present

## 2024-01-26 DIAGNOSIS — G8929 Other chronic pain: Secondary | ICD-10-CM | POA: Diagnosis not present

## 2024-01-26 DIAGNOSIS — Z9229 Personal history of other drug therapy: Secondary | ICD-10-CM | POA: Diagnosis not present

## 2024-01-26 DIAGNOSIS — M459 Ankylosing spondylitis of unspecified sites in spine: Secondary | ICD-10-CM | POA: Diagnosis not present

## 2024-02-07 DIAGNOSIS — G8929 Other chronic pain: Secondary | ICD-10-CM | POA: Diagnosis not present

## 2024-02-07 DIAGNOSIS — M459 Ankylosing spondylitis of unspecified sites in spine: Secondary | ICD-10-CM | POA: Diagnosis not present

## 2024-02-07 DIAGNOSIS — I10 Essential (primary) hypertension: Secondary | ICD-10-CM | POA: Diagnosis not present

## 2024-02-13 ENCOUNTER — Ambulatory Visit: Attending: Cardiology | Admitting: Cardiology

## 2024-02-13 ENCOUNTER — Other Ambulatory Visit: Payer: Self-pay | Admitting: *Deleted

## 2024-02-13 ENCOUNTER — Encounter: Payer: Self-pay | Admitting: Cardiology

## 2024-02-13 VITALS — BP 121/69 | HR 64 | Resp 16 | Ht 74.0 in | Wt 244.4 lb

## 2024-02-13 DIAGNOSIS — I5032 Chronic diastolic (congestive) heart failure: Secondary | ICD-10-CM | POA: Diagnosis not present

## 2024-02-13 DIAGNOSIS — E7800 Pure hypercholesterolemia, unspecified: Secondary | ICD-10-CM | POA: Diagnosis not present

## 2024-02-13 DIAGNOSIS — R0609 Other forms of dyspnea: Secondary | ICD-10-CM

## 2024-02-13 DIAGNOSIS — I25118 Atherosclerotic heart disease of native coronary artery with other forms of angina pectoris: Secondary | ICD-10-CM

## 2024-02-13 MED ORDER — LOSARTAN POTASSIUM 25 MG PO TABS
25.0000 mg | ORAL_TABLET | Freq: Every day | ORAL | 2 refills | Status: DC
Start: 2024-02-13 — End: 2024-04-19

## 2024-02-13 NOTE — Progress Notes (Unsigned)
 Cardiology Office Note:  .   Date:  02/14/2024  ID:  Bebe VEAR Landau, DOB 01-Sep-1950, MRN [id number] PCP: Bertell Satterfield, MD  [facility] Providers Cardiologist:  Gordy Bergamo, MD   History of Present Illness: .   NAOKI MIGLIACCIO is a 73 y.o. male with HTN, hypercholesterolemia, tobacco use disorder, chronic diastolic heart failure presents to establish care, had undergone coronary CT angiogram on 02/28/2023 revealing mid CX CTO and possible mid LAD high-grade stenosis.  Being followed by Dr. Vishnu Priya Mallipeddi, however patient wanted to establish close to [facility] as I know multiple members in his family.  Patient states that after he was started on medical therapy for heart failure, his leg edema is completely resolved, dyspnea is improved significantly, he has not had any further chest discomfort.  He is accompanied by his son.   Discussed the use of AI scribe software for clinical note transcription with the patient, who gave verbal consent to proceed.  History of Present Illness SANTEZ WOODCOX is a 73 year old male with coronary artery disease who presents for evaluation of his heart condition and medication management.  He has moderate plaque buildup in one artery, moderate plaque in the right side artery, and complete closure of the back artery. Shortness of breath has improved with Farxiga  and spironolactone , and he no longer has peripheral edema.  Current medications include Farxiga , spironolactone , and rosuvastatin  40 mg daily. He previously took furosemide  and potassium supplements but has discontinued them.  He has smoked a pack of cigarettes daily since 1971 and acknowledges the need to quit. He experiences significant neck pain that limits his ability to walk for exercise.  Labs    Lab Results  Component Value Date   NA 136 02/11/2023   K 4.1 02/11/2023   CO2 27 02/11/2023   GLUCOSE 154 (H) 02/11/2023   BUN 19 02/11/2023   CREATININE 1.20 02/11/2023    CALCIUM  9.3 02/11/2023   GFRNONAA >60 02/11/2023      Latest Ref Rng & Units 02/11/2023    1:15 PM 06/16/2022    1:04 PM 03/22/2022   10:28 AM  BMP  Glucose 70 - 99 mg/dL 845  813  853   BUN 8 - 23 mg/dL 19  19  12    Creatinine 0.61 - 1.24 mg/dL 8.79  9.05  9.18   BUN/Creat Ratio 6 - 22 (calc)  SEE NOTE:  SEE NOTE:   Sodium 135 - 145 mmol/L 136  138  144   Potassium 3.5 - 5.1 mmol/L 4.1  4.7  5.2   Chloride 98 - 111 mmol/L 98  102  107   CO2 22 - 32 mmol/L 27  30  27    Calcium  8.9 - 10.3 mg/dL 9.3  9.6  89.9    External Labs:  [facility] labs 11/03/2023:  Total cholesterol 08/02/1934, triglycerides 90, HDL 43, LDL 102.  A1c 5.8%.  TSH normal at 0.997.  Serum creatinine 1.010, EGFR 75 mL, potassium 4.1.  Hb 17.4, platelets 212K.  ROS  Review of Systems  Cardiovascular:  Positive for dyspnea on exertion. Negative for chest pain.   Physical Exam:   VS:  BP 121/69 (BP Location: Left Arm, Patient Position: Sitting, Cuff Size: Large)   Pulse 64   Resp 16   Ht 6' 2 (1.88 m)   Wt 244 lb 6.4 oz (110.9 kg)   SpO2 96%   BMI 31.38 kg/m    Wt Readings from Last 3 Encounters:  02/13/24 244 lb 6.4 oz (110.9 kg)  07/22/23 230 lb (104.3 kg)  06/29/23 225 lb (102.1 kg)    Physical Exam Neck:     Vascular: No JVD.  Cardiovascular:     Rate and Rhythm: Normal rate and regular rhythm.     Pulses: Intact distal pulses.          Popliteal pulses are 1+ on the right side and 1+ on the left side.       Dorsalis pedis pulses are 1+ on the right side and 1+ on the left side.       Posterior tibial pulses are 1+ on the right side and 1+ on the left side.     Heart sounds: S1 normal and S2 normal. Murmur heard.     Early systolic murmur is present with a grade of 2/6 at the upper right sternal border.     No gallop.  Pulmonary:     Effort: Pulmonary effort is normal.     Breath sounds: Normal breath sounds.  Abdominal:     General: Bowel sounds are normal.     Palpations: Abdomen is soft.   Musculoskeletal:     Right lower leg: No edema.     Left lower leg: No edema.    Studies Reviewed: SABRA    ECHOCARDIOGRAM COMPLETE 01/27/2023  1. Left ventricular ejection fraction, by estimation, is 55 to 60%. The left ventricle has normal function. The left ventricle has no regional wall motion abnormalities. Left ventricular diastolic parameters are consistent with Grade I diastolic dysfunction (impaired relaxation). 2. Right ventricular systolic function is normal. The right ventricular size is normal. Tricuspid regurgitation signal is inadequate for assessing PA pressure. 3. Right atrial size was mildly dilated. 4. The mitral valve is abnormal. No evidence of mitral valve regurgitation. No evidence of mitral stenosis. 5. The aortic valve is tricuspid. There is moderate calcification of the aortic valve. Aortic valve regurgitation is not visualized. No aortic stenosis is present. 6. Aortic dilatation noted. There is borderline dilatation of the aortic root, measuring 37 mm. 7. The inferior vena cava is normal in size with greater than 50% respiratory variability, suggesting right atrial pressure of 3 mmHg.  CT CORONARY MORPH W/CTA COR W/SCORE 02/28/2023  Coronary calcium  score: The patient's coronary artery calcium  score is 4178, which places the patient in the 97th percentile. Right Coronary Artery: Dominant. Heavily calcified with at least moderate to possibly severe mixed proximal and distal vessel stenosis (50-69%). Left Main Coronary Artery: Short vessel. Bifurcates into the LAD and  LCx arteries. Left Anterior Descending Coronary Artery: Anterior artery that wraps around the apex. Heavy proximal to mid-vessel calcification with moderate to possibly severe stenosis (50-69%). Several small caliber (<2.0 mm) diagonal branches. Left Circumflex Artery: Heavily calcified AV groove vessel proximally with CTO (100%) of the mid vessel, just after a large proximal OM1 branch. The OM branch is  without disease. FFR was positive for mid LCx stenosis (0.75).  Aortic Valve: Trileaflet. Moderately calcified.   EKG:    EKG Interpretation Date/Time:  Monday February 13 2024 10:09:52 EDT Ventricular Rate:  62 PR Interval:  168 QRS Duration:  96 QT Interval:  402 QTC Calculation: 408 R Axis:   -35  Text Interpretation: EKG 02/13/2024: Normal sinus rhythm with rate of 62 bpm, left intrafascicular block.  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  Single PAC. Compared to 02/26/2023, left axis deviation is new. Confirmed by Dorenda Pfannenstiel, Jagadeesh ([id number]) on 02/14/2024 4:58:14 PM  Medications ordered    Meds ordered this encounter  Medications   losartan  (COZAAR ) 25 MG tablet    Sig: Take 1 tablet (25 mg total) by mouth daily.    Dispense:  30 tablet    Refill:  2     ASSESSMENT AND PLAN: .      ICD-10-CM   1. 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 Chronic diastolic congestive heart failure (HCC)  I50.32     3. Hypercholesteremia  E78.00 Basic Metabolic Panel (BMET)    Lipid Profile     Assessment & Plan Coronary artery disease Reviewed coronary CTA, given LVEF being normal by echocardiogram, suspect occluded CX is collateralized.  He remains asymptomatic except for mild chronic dyspnea and has not had any further episodes of chest pain and heart failure symptoms are completely resolved since being on Jardiance and spironolactone . Medical therapy is currently stabilizing the condition. Stents do not necessarily extend life; medical therapy is preferred. If a myocardial infarction occurs, further intervention like bypass surgery may be considered. - Patient also obtained second opinion from [facility], reviewed charts, their recommendations reviewed as well.  Agree with the recommendation regarding  continued medical therapy.  Discussed with the patient. - Continue current medications: Farxiga , spironolactone , and rosuvastatin  40 mg daily. - Add losartan  once daily to protect heart. - Recheck cholesterol levels in two weeks, fasting. - Check LPA cholesterol and kidney function in two weeks. - Encourage lifestyle changes: smoking cessation, dietary modifications, and regular exercise.  Hyperlipidemia Cholesterol remains elevated at 102 mg/dL despite increased rosuvastatin  dose. Goal is to reduce cholesterol to below 70 mg/dL to prevent further plaque buildup. - Recheck cholesterol levels in two weeks, fasting. - Check LPA cholesterol in two weeks.  Nicotine dependence Long-term smoking of one pack per day since 1971. Smoking cessation is crucial to reduce cardiovascular risk and improve overall health. Quitting smoking can repair lungs by 50% in the first year and equate to a non-smoker's risk after ten years. - Encourage smoking cessation. - Discuss nicotine replacement options for short-term use, such as patches or gum. - Highly recommend low-dose CT scan of the chest for lung cancer screening, will send note to his PCP.  Shortness of breath Shortness of breath has improved with current medications (Farxiga  and spironolactone ). No more leg swelling. Medical therapy is effectively managing symptoms. - Continue current medications: Farxiga  and spironolactone .   Signed,  Gordy Bergamo, MD, [facility] 02/14/2024, 4:58 PM [facility] [address] [place], [place] [address] Phone: [phone]. Fax:  [phone]

## 2024-02-13 NOTE — Patient Instructions (Addendum)
 Medication Instructions:  Start Losartan  25 mg by mouth daily  *If you need a refill on your cardiac medications before your next appointment, please call your pharmacy*  Lab Work: Have BMP and lipid profile checked in 2-3 weeks.  This will be fasting.  Can be done at any [facility] location If you have labs (blood work) drawn today and your tests are completely normal, you will receive your results only by: MyChart Message (if you have MyChart) OR A paper copy in the mail If you have any lab test that is abnormal or we need to change your treatment, we will call you to review the results.  Testing/Procedures: none  Follow-Up: At [facility], you and your health needs are our priority.  As part of our continuing mission to provide you with exceptional heart care, our providers are all part of one team.  This team includes your primary Cardiologist (physician) and Advanced Practice Providers or APPs (Physician Assistants and Nurse Practitioners) who all work together to provide you with the care you need, when you need it.  Your next appointment:  9/25 at 9;20   Provider:   Gordy Bergamo, MD    We recommend signing up for the patient portal called MyChart.  Sign up information is provided on this After Visit Summary.  MyChart is used to connect with patients for Virtual Visits (Telemedicine).  Patients are able to view lab/test results, encounter notes, upcoming appointments, etc.  Non-urgent messages can be sent to your provider as well.   To learn more about what you can do with MyChart, go to [email or web address].   Other Instructions  You may go to any of these [facility] locations:   [facility] - [address] ([facility]) - [address] [phone] [address] - [address] (1st floor, next to pharmacy)    - [address]    [place]  - [address]    [place] -  [address] [employer]  - [address] ([facility])  [place]   - [address], [address]

## 2024-02-17 ENCOUNTER — Other Ambulatory Visit: Payer: Self-pay | Admitting: Internal Medicine

## 2024-02-21 ENCOUNTER — Telehealth: Payer: Self-pay | Admitting: Cardiology

## 2024-02-21 NOTE — Telephone Encounter (Signed)
 I spoke with patient.  He started Losartan  on 7/18.  He will have lab work done 2 weeks after he started medication

## 2024-02-21 NOTE — Telephone Encounter (Signed)
 Patient states that he just picked up the medication on Friday. Calling to see when would the dr like for him to have labs done. Please advise

## 2024-02-28 DIAGNOSIS — I110 Hypertensive heart disease with heart failure: Secondary | ICD-10-CM | POA: Diagnosis not present

## 2024-02-28 DIAGNOSIS — M459 Ankylosing spondylitis of unspecified sites in spine: Secondary | ICD-10-CM | POA: Diagnosis not present

## 2024-02-28 DIAGNOSIS — G8929 Other chronic pain: Secondary | ICD-10-CM | POA: Diagnosis not present

## 2024-02-28 DIAGNOSIS — I10 Essential (primary) hypertension: Secondary | ICD-10-CM | POA: Diagnosis not present

## 2024-02-29 ENCOUNTER — Telehealth: Payer: Self-pay | Admitting: Cardiology

## 2024-02-29 NOTE — Telephone Encounter (Signed)
Returned call to patient left message on personal voice mail to call back. 

## 2024-02-29 NOTE — Telephone Encounter (Signed)
 Patient called to follow-up on Dr. Godfrey recommendation to be sent to patient's PCP for patient to have a scan done.

## 2024-03-01 NOTE — Telephone Encounter (Signed)
 Left message to call office

## 2024-03-02 DIAGNOSIS — E7800 Pure hypercholesterolemia, unspecified: Secondary | ICD-10-CM | POA: Diagnosis not present

## 2024-03-02 DIAGNOSIS — I25118 Atherosclerotic heart disease of native coronary artery with other forms of angina pectoris: Secondary | ICD-10-CM | POA: Diagnosis not present

## 2024-03-02 DIAGNOSIS — R0609 Other forms of dyspnea: Secondary | ICD-10-CM | POA: Diagnosis not present

## 2024-03-03 LAB — LIPID PANEL
Chol/HDL Ratio: 3.4 ratio (ref 0.0–5.0)
Cholesterol, Total: 118 mg/dL (ref 100–199)
HDL: 35 mg/dL — ABNORMAL LOW (ref >39)
LDL Chol Calc (NIH): 61 mg/dL (ref 0–99)
Triglycerides: 120 mg/dL (ref 0–149)
VLDL Cholesterol Cal: 22 mg/dL (ref 5–40)

## 2024-03-03 LAB — BASIC METABOLIC PANEL WITH GFR
BUN/Creatinine Ratio: 8 — ABNORMAL LOW (ref 10–24)
BUN: 8 mg/dL (ref 8–27)
CO2: 24 mmol/L (ref 20–29)
Calcium: 9.9 mg/dL (ref 8.6–10.2)
Chloride: 101 mmol/L (ref 96–106)
Creatinine, Ser: 1.01 mg/dL (ref 0.76–1.27)
Glucose: 107 mg/dL — ABNORMAL HIGH (ref 70–99)
Potassium: 4.8 mmol/L (ref 3.5–5.2)
Sodium: 139 mmol/L (ref 134–144)
eGFR: 79 mL/min/1.73 (ref >59)

## 2024-03-05 ENCOUNTER — Ambulatory Visit: Payer: Self-pay | Admitting: *Deleted

## 2024-03-05 NOTE — Telephone Encounter (Signed)
 I spoke with patient and let him know Dr Ladona had recommended low-dose CT scan of the chest for lung cancer screening to be ordered by PCP and that note had been sent to Dr Bertell. Patient will follow up with PCP.

## 2024-03-06 NOTE — Progress Notes (Signed)
 Lipids are at goal and renal function is normal. Continue present medications

## 2024-03-07 DIAGNOSIS — M459 Ankylosing spondylitis of unspecified sites in spine: Secondary | ICD-10-CM | POA: Diagnosis not present

## 2024-03-07 DIAGNOSIS — Z122 Encounter for screening for malignant neoplasm of respiratory organs: Secondary | ICD-10-CM | POA: Diagnosis not present

## 2024-03-07 DIAGNOSIS — I110 Hypertensive heart disease with heart failure: Secondary | ICD-10-CM | POA: Diagnosis not present

## 2024-03-07 DIAGNOSIS — G8929 Other chronic pain: Secondary | ICD-10-CM | POA: Diagnosis not present

## 2024-03-07 DIAGNOSIS — I10 Essential (primary) hypertension: Secondary | ICD-10-CM | POA: Diagnosis not present

## 2024-03-12 ENCOUNTER — Encounter (HOSPITAL_COMMUNITY): Payer: Self-pay | Admitting: Internal Medicine

## 2024-03-30 ENCOUNTER — Encounter (HOSPITAL_COMMUNITY): Payer: Self-pay | Admitting: Internal Medicine

## 2024-03-30 DIAGNOSIS — Z122 Encounter for screening for malignant neoplasm of respiratory organs: Secondary | ICD-10-CM

## 2024-04-04 ENCOUNTER — Other Ambulatory Visit (HOSPITAL_COMMUNITY): Payer: Self-pay | Admitting: Internal Medicine

## 2024-04-04 DIAGNOSIS — Z122 Encounter for screening for malignant neoplasm of respiratory organs: Secondary | ICD-10-CM

## 2024-04-05 DIAGNOSIS — I10 Essential (primary) hypertension: Secondary | ICD-10-CM | POA: Diagnosis not present

## 2024-04-05 DIAGNOSIS — G8929 Other chronic pain: Secondary | ICD-10-CM | POA: Diagnosis not present

## 2024-04-05 DIAGNOSIS — I110 Hypertensive heart disease with heart failure: Secondary | ICD-10-CM | POA: Diagnosis not present

## 2024-04-16 DIAGNOSIS — E032 Hypothyroidism due to medicaments and other exogenous substances: Secondary | ICD-10-CM | POA: Diagnosis not present

## 2024-04-16 DIAGNOSIS — M542 Cervicalgia: Secondary | ICD-10-CM | POA: Diagnosis not present

## 2024-04-16 DIAGNOSIS — I10 Essential (primary) hypertension: Secondary | ICD-10-CM | POA: Diagnosis not present

## 2024-04-16 DIAGNOSIS — I5022 Chronic systolic (congestive) heart failure: Secondary | ICD-10-CM | POA: Diagnosis not present

## 2024-04-19 ENCOUNTER — Other Ambulatory Visit: Payer: Self-pay | Admitting: Cardiology

## 2024-04-19 DIAGNOSIS — I25118 Atherosclerotic heart disease of native coronary artery with other forms of angina pectoris: Secondary | ICD-10-CM

## 2024-04-20 DIAGNOSIS — I10 Essential (primary) hypertension: Secondary | ICD-10-CM | POA: Diagnosis not present

## 2024-04-20 DIAGNOSIS — I5022 Chronic systolic (congestive) heart failure: Secondary | ICD-10-CM | POA: Diagnosis not present

## 2024-04-20 DIAGNOSIS — M542 Cervicalgia: Secondary | ICD-10-CM | POA: Diagnosis not present

## 2024-04-26 ENCOUNTER — Ambulatory Visit: Attending: Cardiology | Admitting: Cardiology

## 2024-04-26 ENCOUNTER — Encounter: Payer: Self-pay | Admitting: Cardiology

## 2024-04-26 VITALS — BP 108/66 | HR 66 | Ht 72.0 in | Wt 240.0 lb

## 2024-04-26 DIAGNOSIS — I25118 Atherosclerotic heart disease of native coronary artery with other forms of angina pectoris: Secondary | ICD-10-CM | POA: Diagnosis not present

## 2024-04-26 DIAGNOSIS — F17209 Nicotine dependence, unspecified, with unspecified nicotine-induced disorders: Secondary | ICD-10-CM

## 2024-04-26 DIAGNOSIS — E7800 Pure hypercholesterolemia, unspecified: Secondary | ICD-10-CM | POA: Diagnosis not present

## 2024-04-26 DIAGNOSIS — I10 Essential (primary) hypertension: Secondary | ICD-10-CM | POA: Diagnosis not present

## 2024-04-26 DIAGNOSIS — F17210 Nicotine dependence, cigarettes, uncomplicated: Secondary | ICD-10-CM

## 2024-04-26 MED ORDER — NICOTINE 14 MG/24HR TD PT24
14.0000 mg | MEDICATED_PATCH | Freq: Every day | TRANSDERMAL | 12 refills | Status: AC
Start: 2024-04-26 — End: ?

## 2024-04-26 MED ORDER — VARENICLINE TARTRATE (STARTER) 0.5 MG X 11 & 1 MG X 42 PO TBPK
0.5000 mg | ORAL_TABLET | Freq: Every day | ORAL | 3 refills | Status: AC
Start: 2024-04-26 — End: ?

## 2024-04-26 NOTE — Patient Instructions (Signed)
 Medication Instructions:  Start Chantix  as discussed with your provider Start Nicotine  patch as discussed with your provider *If you need a refill on your cardiac medications before your next appointment, please call your pharmacy*  Lab Work: none If you have labs (blood work) drawn today and your tests are completely normal, you will receive your results only by: MyChart Message (if you have MyChart) OR A paper copy in the mail If you have any lab test that is abnormal or we need to change your treatment, we will call you to review the results.  Testing/Procedures: none  Follow-Up: At [facility], you and your health needs are our priority.  As part of our continuing mission to provide you with exceptional heart care, our providers are all part of one team.  This team includes your primary Cardiologist (physician) and Advanced Practice Providers or APPs (Physician Assistants and Nurse Practitioners) who all work together to provide you with the care you need, when you need it.  Your next appointment:   3 months  Provider:   Dr. Ladona  We recommend signing up for the patient portal called MyChart.  Sign up information is provided on this After Visit Summary.  MyChart is used to connect with patients for Virtual Visits (Telemedicine).  Patients are able to view lab/test results, encounter notes, upcoming appointments, etc.  Non-urgent messages can be sent to your provider as well.   To learn more about what you can do with MyChart, go to [email or web address].   Other Instructions none

## 2024-04-26 NOTE — Progress Notes (Signed)
 Cardiology Office Note:  .   Date:  04/26/2024  ID:  Francisco Burns, DOB 07-Aug-1950, MRN [id number] PCP: Bertell Satterfield, MD  [facility] Providers Cardiologist:  Gordy Bergamo, MD   History of Present Illness: .   Francisco Burns is a 73 y.o. male with HTN, hypercholesterolemia, continuous tobacco use disorder, chronic diastolic heart failure, CAD with coronary CT angiogram on 02/28/2023 revealing mid CX CTO and possible mid LAD high-grade stenosis.  Since being on GDP, patient had noticed marked improvement in overall wellbeing, with no recurrence of chest pain and dyspnea has remained stable.  He now presents for follow-up, on his last office visit I had added Zetia and also losartan .  Cardiac Studies relevent.    ECHOCARDIOGRAM COMPLETE 01/27/2023  1. Left ventricular ejection fraction, by estimation, is 55 to 60%. The left ventricle has normal function. The left ventricle has no regional wall motion abnormalities. Left ventricular  diastolic parameters are consistent with Grade I diastolic dysfunction (impaired relaxation). 2. Right ventricular systolic function is normal. The right ventricular size is normal. Tricuspid regurgitation signal is inadequate for assessing PA pressure. 3. The aortic valve is tricuspid. There is moderate calcification of the aortic valve. Aortic valve regurgitation is not visualized. No aortic stenosis is present.   CT CORONARY MORPH W/CTA COR W/SCORE 02/28/2023     Coronary calcium  score: The patient's coronary artery calcium  score is 4178, which places the patient in the 97th percentile.  Left Anterior Descending Coronary Artery: Anterior artery that wraps around the apex. Heavy proximal to mid-vessel calcification with moderate to possibly severe stenosis (50-69%). Left Circumflex Artery: Heavily calcified AV groove vessel proximally with CTO (100%) of the mid vessel, just after a large proximal OM1 branch.    Discussed the use of AI scribe software for  clinical note transcription with the patient, who gave verbal consent to proceed.  History of Present Illness Francisco Burns is a 73 year old male with coronary artery disease who presents with shortness of breath on exertion.  Shortness of breath occurs with activities requiring straining, such as lifting heavy objects, but not with less strenuous activities like cleaning or shampooing. There is no associated chest discomfort or leg cramps during walking.  A coronary CT angiogram on February 28, 2023, showed a 70% blockage in the LAD and a 100% blockage in a small artery at the back of the heart. An echocardiogram indicated normal heart function.  Current medications include those for cholesterol and blood pressure, with recent addition of losartan . Cholesterol levels are now below 70.  He smokes slightly over a pack of cigarettes per day and has not attempted smoking cessation medications. He expresses a desire to quit smoking.   Labs   Lab Results  Component Value Date   CHOL 118 03/02/2024   HDL 35 (L) 03/02/2024   LDLCALC 61 03/02/2024   TRIG 120 03/02/2024   CHOLHDL 3.4 03/02/2024   No results found for: LIPOA  Recent Labs    03/02/24 0835  NA 139  K 4.8  CL 101  CO2 24  GLUCOSE 107*  BUN 8  CREATININE 1.01  CALCIUM  9.9    Lab Results  Component Value Date   ALT 13 06/16/2022   AST 11 06/16/2022   BILITOT 0.5 06/16/2022   Care everywhere/Faxed External Labs:  [facility] labs 11/03/2023:   Total cholesterol 08/02/1934, triglycerides 90, HDL 43, LDL 102.  ROS  Review of Systems  Cardiovascular:  Positive for dyspnea on  exertion. Negative for chest pain, claudication and leg swelling.  Musculoskeletal:  Positive for arthritis (chronic neck pain).   Physical Exam:   VS:  BP 108/66   Pulse 66   Ht 6' (1.829 m)   Wt 240 lb (108.9 kg)   SpO2 98%   BMI 32.55 kg/m    Wt Readings from Last 3 Encounters:  04/26/24 240 lb (108.9 kg)  02/13/24 244 lb 6.4 oz (110.9 kg)   07/22/23 230 lb (104.3 kg)    BP Readings from Last 3 Encounters:  04/26/24 108/66  02/13/24 121/69  07/22/23 136/68   Physical Exam Neck:     Vascular: No JVD.  Cardiovascular:     Rate and Rhythm: Normal rate and regular rhythm.     Pulses:          Dorsalis pedis pulses are 1+ on the right side and 1+ on the left side.       Posterior tibial pulses are 1+ on the right side and 1+ on the left side.     Heart sounds: S1 normal and S2 normal. Murmur heard.     Early systolic murmur is present with a grade of 2/6 at the upper right sternal border.     No gallop.  Pulmonary:     Effort: Pulmonary effort is normal.     Breath sounds: Normal breath sounds.  Abdominal:     General: Bowel sounds are normal.     Palpations: Abdomen is soft.  Musculoskeletal:     Right lower leg: No edema.     Left lower leg: No edema.    EKG:         ASSESSMENT AND PLAN: .      ICD-10-CM   1. Coronary artery disease involving native coronary artery of native heart with other form of angina pectoris  I25.118     2. Hypercholesteremia  E78.00     3. Tobacco use disorder, continuous  F17.209 Varenicline  Tartrate, Starter, (CHANTIX  STARTING MONTH PAK) 0.5 MG X 11 & 1 MG X 42 TBPK    nicotine  (NICODERM CQ  - DOSED IN MG/24 HOURS) 14 mg/24hr patch    4. Primary hypertension  I10       Assessment & Plan Atherosclerotic heart disease of native coronary artery with other forms of angina Reports exertional dyspnea, especially during strenuous activities. Previous coronary CT angiogram showed 70% LAD blockage and 100% blockage in a small posterior artery. Echocardiogram indicated normal heart function, suggesting collateral circulation. No chest pain during walking, but neck pain from a past fracture limits endurance. - Encourage regular walking for 10-15 minutes daily, adjusting pace as needed. - Instruct to report any new or worsening symptoms, such as chest discomfort or increased  dyspnea.  Pure hypercholesterolemia Cholesterol levels improved to below 70 with additional Zetia to Crestor  20 medication regimen, indicating effective management and contribution to cardiovascular health.  Nicotine  dependence, cigarettes Smokes over a pack per day and desires to quit. Discussed Chantix  and nicotine  patches as a combined cessation approach. Emphasized setting a quit date and health benefits of quitting, including improved respiratory function and reduced healthcare costs. - Prescribe Chantix  starter and continuation packs with two refills. - Prescribe nicotine  patches with a tapering schedule: high, moderate, then low strength over three months. - Set a quit date for one week from today. - Encourage finding an oral fixation substitute, such as gum. - Additional 7 minutes spent on smoking cessation and medication management.  Primary hypertension Presently  tolerating spironolactone  25 mg daily and losartan  25 mg daily, normal renal function and potassium levels are normal.   Follow up: 3 months to follow-up on dyspnea as anginal equivalent and also smoking cessation specifically.  Signed,  Gordy Bergamo, MD, [facility] 04/26/2024, 9:30 AM [facility] [address] [place], [place] [address] Phone: [phone]. Fax:  [phone]

## 2024-05-05 ENCOUNTER — Other Ambulatory Visit: Payer: Self-pay | Admitting: Internal Medicine

## 2024-05-07 ENCOUNTER — Telehealth: Payer: Self-pay | Admitting: Cardiology

## 2024-05-07 DIAGNOSIS — I25118 Atherosclerotic heart disease of native coronary artery with other forms of angina pectoris: Secondary | ICD-10-CM

## 2024-05-07 MED ORDER — LOSARTAN POTASSIUM 25 MG PO TABS: 25.0000 mg | ORAL_TABLET | Freq: Every day | ORAL | 3 refills | Status: DC

## 2024-05-07 MED ORDER — ROSUVASTATIN CALCIUM 20 MG PO TABS: 20.0000 mg | ORAL_TABLET | Freq: Every day | ORAL | 3 refills | Status: DC

## 2024-05-07 NOTE — Telephone Encounter (Signed)
*  STAT* If patient is at the pharmacy, call can be transferred to refill team.   1. Which medications need to be refilled? (please list name of each medication and dose if known)   rosuvastatin  (CRESTOR ) 20 MG tablet    losartan  (COZAAR ) 25 MG tablet    2. Would you like to learn more about the convenience, safety, & potential cost savings by using the [facility]? No     3. Are you open to using the [facility] ([facility]. No   4. Which pharmacy/location (including street and city if local pharmacy) is medication to be sent to? [facility] - [place], [place] - [address]    5. Do they need a 30 day or 90 day supply? 90 day

## 2024-05-07 NOTE — Telephone Encounter (Signed)
 RX sent in

## 2024-05-11 ENCOUNTER — Ambulatory Visit (HOSPITAL_COMMUNITY)
Admission: RE | Admit: 2024-05-11 | Discharge: 2024-05-11 | Disposition: A | Discharge status: Home / Self Care | Source: Ambulatory Visit | Attending: Internal Medicine | Admitting: Internal Medicine

## 2024-05-11 DIAGNOSIS — I700 Atherosclerosis of aorta: Secondary | ICD-10-CM | POA: Diagnosis not present

## 2024-05-11 DIAGNOSIS — F17210 Nicotine dependence, cigarettes, uncomplicated: Secondary | ICD-10-CM | POA: Insufficient documentation

## 2024-05-11 DIAGNOSIS — Z122 Encounter for screening for malignant neoplasm of respiratory organs: Secondary | ICD-10-CM | POA: Diagnosis present

## 2024-05-11 DIAGNOSIS — J439 Emphysema, unspecified: Secondary | ICD-10-CM | POA: Diagnosis not present

## 2024-05-11 DIAGNOSIS — I2510 Atherosclerotic heart disease of native coronary artery without angina pectoris: Secondary | ICD-10-CM | POA: Diagnosis not present

## 2024-05-21 ENCOUNTER — Other Ambulatory Visit: Payer: Self-pay | Admitting: Internal Medicine

## 2024-06-08 ENCOUNTER — Telehealth: Payer: Self-pay | Admitting: Cardiology

## 2024-06-08 NOTE — Telephone Encounter (Signed)
 Pt calling to ask office to call his insurance and complete his verification of chronic condition for [employer]. Pt has the form, but lives 30 miles away. The number is [phone]

## 2024-06-08 NOTE — Telephone Encounter (Signed)
 Spoke with pt and advised pt if he cannot drop paper off to the office to please mail to our office at:  [address] [place], [place] [address] Pt verbalizes understanding and agrees with current plan.

## 2024-07-12 ENCOUNTER — Telehealth: Payer: Self-pay | Admitting: Cardiology

## 2024-07-12 MED ORDER — ROSUVASTATIN CALCIUM 40 MG PO TABS: 40.0000 mg | ORAL_TABLET | Freq: Every day | ORAL | 3 refills | Status: AC

## 2024-07-12 NOTE — Telephone Encounter (Signed)
 Crestor  was increased by Reyes Cowden, DO Spoke to Mr. Barkdull about the results of his blood work.  Overall everything looks pretty good.  His LDL cholesterol is 83.  Would prefer to see it below 70.  Will increase his Crestor  from 20 to 40 mg.  Otherwise we will make no changes.        Electronically signed by Reyes Cowden, DO at 09/02/2023 12:09 PM   Will verify with Dr Ladona if OK to refill for 40 mg daily.

## 2024-07-12 NOTE — Telephone Encounter (Signed)
 Pt c/o medication issue:  1. Name of Medication:   rosuvastatin  (CRESTOR ) 20 MG tablet   2. How are you currently taking this medication (dosage and times per day)?   Patient taking 40 mg dosage, 1x daily in the evening  3. Are you having a reaction (difficulty breathing--STAT)?   4. What is your medication issue?   Patient stated he has been taking two 20 mg tablets daily and is now out of this medication.  Patient stated he will need a refill for the 40 mg dosage sent to [facility] [place] [place] - [address] .

## 2024-07-12 NOTE — Telephone Encounter (Signed)
 Prescription sent

## 2024-07-12 NOTE — Telephone Encounter (Signed)
 Yes please I would like him to be on 40 mg of rosuvastatin .  Thank you

## 2024-08-06 ENCOUNTER — Ambulatory Visit: Admitting: Cardiology

## 2024-08-08 ENCOUNTER — Other Ambulatory Visit: Payer: Self-pay | Admitting: Internal Medicine

## 2024-08-15 ENCOUNTER — Telehealth: Payer: Self-pay | Admitting: Cardiology

## 2024-08-15 NOTE — Telephone Encounter (Signed)
" °*  STAT* If patient is at the pharmacy, call can be transferred to refill team.   1. Which medications need to be refilled? (please list name of each medication and dose if known) losartan  (COZAAR ) 25 MG tablet    2. Would you like to learn more about the convenience, safety, & potential cost savings by using the [facility]? No    3. Are you open to using the [facility] (Type [facility]. No    4. Which pharmacy/location (including street and city if local pharmacy) is medication to be sent to?[facility] - [place], [place] - [address]    5. Do they need a 30 day or 90 day supply? 90 day   "

## 2024-08-21 ENCOUNTER — Other Ambulatory Visit: Payer: Self-pay | Admitting: Cardiology

## 2024-08-21 DIAGNOSIS — I25118 Atherosclerotic heart disease of native coronary artery with other forms of angina pectoris: Secondary | ICD-10-CM

## 2024-08-22 NOTE — Telephone Encounter (Signed)
 Refill was sent 05/2024 for 1 year

## 2024-08-27 ENCOUNTER — Ambulatory Visit: Admitting: Cardiology

## 2024-10-29 ENCOUNTER — Ambulatory Visit: Admitting: Cardiology
